# Patient Record
Sex: Male | Born: 1970 | Race: Black or African American | Hispanic: No | Marital: Married | Smoking: Former smoker
Health system: Southern US, Community
[De-identification: ages and names within clinical notes are randomized; demographics above are authoritative.]

## PROBLEM LIST (undated history)

## (undated) DIAGNOSIS — G4700 Insomnia, unspecified: Secondary | ICD-10-CM

## (undated) DIAGNOSIS — N419 Inflammatory disease of prostate, unspecified: Secondary | ICD-10-CM

## (undated) DIAGNOSIS — K219 Gastro-esophageal reflux disease without esophagitis: Secondary | ICD-10-CM

## (undated) DIAGNOSIS — B001 Herpesviral vesicular dermatitis: Secondary | ICD-10-CM

## (undated) DIAGNOSIS — C801 Malignant (primary) neoplasm, unspecified: Secondary | ICD-10-CM

## (undated) DIAGNOSIS — B351 Tinea unguium: Secondary | ICD-10-CM

## (undated) DIAGNOSIS — M545 Low back pain, unspecified: Secondary | ICD-10-CM

## (undated) DIAGNOSIS — F419 Anxiety disorder, unspecified: Secondary | ICD-10-CM

## (undated) HISTORY — DX: Inflammatory disease of prostate, unspecified: N41.9

## (undated) HISTORY — DX: Low back pain, unspecified: M54.50

## (undated) HISTORY — DX: Low back pain: M54.5

## (undated) HISTORY — DX: Malignant (primary) neoplasm, unspecified: C80.1

## (undated) HISTORY — DX: Tinea unguium: B35.1

## (undated) HISTORY — DX: Insomnia, unspecified: G47.00

## (undated) HISTORY — DX: Anxiety disorder, unspecified: F41.9

## (undated) HISTORY — PX: OTHER SURGICAL HISTORY: SHX169

## (undated) HISTORY — DX: Gastro-esophageal reflux disease without esophagitis: K21.9

## (undated) HISTORY — DX: Herpesviral vesicular dermatitis: B00.1

---

## 2001-07-21 ENCOUNTER — Inpatient Hospital Stay (HOSPITAL_COMMUNITY): Admission: RE | Admit: 2001-07-21 | Discharge: 2001-07-22 | Payer: Self-pay | Admitting: Orthopaedic Surgery

## 2004-10-08 ENCOUNTER — Ambulatory Visit: Payer: Self-pay | Admitting: Family Medicine

## 2004-10-31 ENCOUNTER — Ambulatory Visit: Payer: Self-pay | Admitting: Family Medicine

## 2005-06-23 ENCOUNTER — Ambulatory Visit: Payer: Self-pay | Admitting: Family Medicine

## 2006-06-30 ENCOUNTER — Ambulatory Visit: Payer: Self-pay | Admitting: Family Medicine

## 2006-07-20 ENCOUNTER — Ambulatory Visit: Payer: Self-pay | Admitting: Family Medicine

## 2006-07-20 LAB — CONVERTED CEMR LAB
ALT: 35 units/L (ref 0–53)
AST: 42 units/L — ABNORMAL HIGH (ref 0–37)
Albumin: 4.2 g/dL (ref 3.5–5.2)
Alkaline Phosphatase: 46 units/L (ref 39–117)
BUN: 16 mg/dL (ref 6–23)
Basophils Absolute: 0 10*3/uL (ref 0.0–0.1)
Basophils Relative: 0.3 % (ref 0.0–1.0)
Bilirubin, Direct: 0.2 mg/dL (ref 0.0–0.3)
CO2: 29 meq/L (ref 19–32)
Calcium: 9 mg/dL (ref 8.4–10.5)
Chloride: 106 meq/L (ref 96–112)
Cholesterol: 178 mg/dL (ref 0–200)
Creatinine, Ser: 0.9 mg/dL (ref 0.4–1.5)
Eosinophils Absolute: 0.2 10*3/uL (ref 0.0–0.6)
Eosinophils Relative: 2.6 % (ref 0.0–5.0)
GFR calc Af Amer: 122 mL/min
GFR calc non Af Amer: 101 mL/min
Glucose, Bld: 62 mg/dL — ABNORMAL LOW (ref 70–99)
HCT: 42.1 % (ref 39.0–52.0)
HDL: 89.1 mg/dL (ref >39.0)
Hemoglobin: 13.5 g/dL (ref 13.0–17.0)
LDL Cholesterol: 78 mg/dL (ref 0–99)
Lymphocytes Relative: 55.7 % — ABNORMAL HIGH (ref 12.0–46.0)
MCHC: 32.2 g/dL (ref 30.0–36.0)
MCV: 72.5 fL — ABNORMAL LOW (ref 78.0–100.0)
Monocytes Absolute: 0.6 10*3/uL (ref 0.2–0.7)
Monocytes Relative: 8.7 % (ref 3.0–11.0)
Neutro Abs: 2.3 10*3/uL (ref 1.4–7.7)
Neutrophils Relative %: 32.7 % — ABNORMAL LOW (ref 43.0–77.0)
Platelets: 263 10*3/uL (ref 150–400)
Potassium: 3.9 meq/L (ref 3.5–5.1)
RBC: 5.8 M/uL (ref 4.22–5.81)
RDW: 15.7 % — ABNORMAL HIGH (ref 11.5–14.6)
Sodium: 141 meq/L (ref 135–145)
TSH: 0.61 microintl units/mL (ref 0.35–5.50)
Total Bilirubin: 1.6 mg/dL — ABNORMAL HIGH (ref 0.3–1.2)
Total CHOL/HDL Ratio: 2
Total Protein: 6.6 g/dL (ref 6.0–8.3)
Triglycerides: 57 mg/dL (ref 0–149)
VLDL: 11 mg/dL (ref 0–40)
WBC: 6.9 10*3/uL (ref 4.5–10.5)

## 2006-07-23 ENCOUNTER — Ambulatory Visit: Payer: Self-pay | Admitting: Family Medicine

## 2006-08-14 ENCOUNTER — Encounter: Payer: Self-pay | Admitting: Family Medicine

## 2006-08-14 LAB — HM COLONOSCOPY

## 2006-08-26 ENCOUNTER — Encounter: Payer: Self-pay | Admitting: Family Medicine

## 2006-09-03 ENCOUNTER — Encounter: Payer: Self-pay | Admitting: Family Medicine

## 2006-10-15 DIAGNOSIS — F411 Generalized anxiety disorder: Secondary | ICD-10-CM | POA: Insufficient documentation

## 2006-10-15 DIAGNOSIS — K219 Gastro-esophageal reflux disease without esophagitis: Secondary | ICD-10-CM | POA: Insufficient documentation

## 2006-10-15 DIAGNOSIS — M545 Low back pain, unspecified: Secondary | ICD-10-CM | POA: Insufficient documentation

## 2007-01-14 HISTORY — PX: OTHER SURGICAL HISTORY: SHX169

## 2007-02-19 ENCOUNTER — Ambulatory Visit: Payer: Self-pay | Admitting: Family Medicine

## 2007-02-19 LAB — CONVERTED CEMR LAB
ALT: 51 units/L (ref 0–53)
AST: 46 units/L — ABNORMAL HIGH (ref 0–37)
Albumin: 4.2 g/dL (ref 3.5–5.2)
Alkaline Phosphatase: 45 units/L (ref 39–117)
BUN: 12 mg/dL (ref 6–23)
Basophils Absolute: 0 10*3/uL (ref 0.0–0.1)
Basophils Relative: 0 % (ref 0.0–1.0)
Bilirubin Urine: NEGATIVE
Bilirubin, Direct: 0.3 mg/dL (ref 0.0–0.3)
Blood in Urine, dipstick: NEGATIVE
CO2: 31 meq/L (ref 19–32)
Calcium: 9.4 mg/dL (ref 8.4–10.5)
Chloride: 104 meq/L (ref 96–112)
Cholesterol: 166 mg/dL (ref 0–200)
Creatinine, Ser: 1.1 mg/dL (ref 0.4–1.5)
Eosinophils Absolute: 0.1 10*3/uL (ref 0.0–0.6)
Eosinophils Relative: 1.7 % (ref 0.0–5.0)
GFR calc Af Amer: 96 mL/min
GFR calc non Af Amer: 80 mL/min
Glucose, Bld: 77 mg/dL (ref 70–99)
Glucose, Urine, Semiquant: NEGATIVE
HCT: 40.2 % (ref 39.0–52.0)
HDL: 75.1 mg/dL (ref >39.0)
Hemoglobin: 12.8 g/dL — ABNORMAL LOW (ref 13.0–17.0)
Ketones, urine, test strip: NEGATIVE
LDL Cholesterol: 78 mg/dL (ref 0–99)
Lymphocytes Relative: 53.5 % — ABNORMAL HIGH (ref 12.0–46.0)
MCHC: 31.8 g/dL (ref 30.0–36.0)
MCV: 71.6 fL — ABNORMAL LOW (ref 78.0–100.0)
Monocytes Absolute: 0.5 10*3/uL (ref 0.2–0.7)
Monocytes Relative: 6.9 % (ref 3.0–11.0)
Neutro Abs: 2.5 10*3/uL (ref 1.4–7.7)
Neutrophils Relative %: 37.9 % — ABNORMAL LOW (ref 43.0–77.0)
Nitrite: NEGATIVE
Platelets: 271 10*3/uL (ref 150–400)
Potassium: 5 meq/L (ref 3.5–5.1)
RBC: 5.62 M/uL (ref 4.22–5.81)
RDW: 15.6 % — ABNORMAL HIGH (ref 11.5–14.6)
Sodium: 142 meq/L (ref 135–145)
Specific Gravity, Urine: >=1.03
TSH: 0.26 microintl units/mL — ABNORMAL LOW (ref 0.35–5.50)
Total Bilirubin: 1.3 mg/dL — ABNORMAL HIGH (ref 0.3–1.2)
Total CHOL/HDL Ratio: 2.2
Total Protein: 6.3 g/dL (ref 6.0–8.3)
Triglycerides: 63 mg/dL (ref 0–149)
Urobilinogen, UA: 0.2
VLDL: 13 mg/dL (ref 0–40)
WBC Urine, dipstick: NEGATIVE
WBC: 6.7 10*3/uL (ref 4.5–10.5)
pH: 5.5

## 2007-03-09 ENCOUNTER — Ambulatory Visit: Payer: Self-pay | Admitting: Family Medicine

## 2007-04-04 ENCOUNTER — Emergency Department (HOSPITAL_COMMUNITY): Admission: EM | Admit: 2007-04-04 | Discharge: 2007-04-04 | Payer: Self-pay | Admitting: Family Medicine

## 2007-04-07 ENCOUNTER — Encounter: Payer: Self-pay | Admitting: Family Medicine

## 2007-04-16 ENCOUNTER — Encounter: Payer: Self-pay | Admitting: Family Medicine

## 2007-06-02 ENCOUNTER — Encounter: Payer: Self-pay | Admitting: Family Medicine

## 2007-07-07 ENCOUNTER — Ambulatory Visit (HOSPITAL_COMMUNITY): Admission: RE | Admit: 2007-07-07 | Discharge: 2007-07-08 | Payer: Self-pay | Admitting: Orthopaedic Surgery

## 2007-07-15 ENCOUNTER — Encounter: Payer: Self-pay | Admitting: Family Medicine

## 2007-11-22 ENCOUNTER — Ambulatory Visit: Payer: Self-pay | Admitting: Family Medicine

## 2008-03-20 ENCOUNTER — Ambulatory Visit: Payer: Self-pay | Admitting: Family Medicine

## 2008-03-20 LAB — CONVERTED CEMR LAB
Bilirubin Urine: NEGATIVE
Blood in Urine, dipstick: NEGATIVE
Glucose, Urine, Semiquant: NEGATIVE
Ketones, urine, test strip: NEGATIVE
Nitrite: NEGATIVE
Protein, U semiquant: NEGATIVE
Specific Gravity, Urine: 1.02
Urobilinogen, UA: 0.2
WBC Urine, dipstick: NEGATIVE
pH: 6.5

## 2008-03-22 LAB — CONVERTED CEMR LAB
ALT: 42 units/L (ref 0–53)
AST: 39 units/L — ABNORMAL HIGH (ref 0–37)
Albumin: 4.5 g/dL (ref 3.5–5.2)
Alkaline Phosphatase: 54 units/L (ref 39–117)
BUN: 12 mg/dL (ref 6–23)
Basophils Absolute: 0 10*3/uL (ref 0.0–0.1)
Basophils Relative: 0.1 % (ref 0.0–3.0)
Bilirubin, Direct: 0.2 mg/dL (ref 0.0–0.3)
CO2: 29 meq/L (ref 19–32)
Calcium: 9.2 mg/dL (ref 8.4–10.5)
Chloride: 101 meq/L (ref 96–112)
Cholesterol: 167 mg/dL (ref 0–200)
Creatinine, Ser: 0.9 mg/dL (ref 0.4–1.5)
Eosinophils Absolute: 0.1 10*3/uL (ref 0.0–0.7)
Eosinophils Relative: 1.7 % (ref 0.0–5.0)
GFR calc Af Amer: 121 mL/min
GFR calc non Af Amer: 100 mL/min
Glucose, Bld: 85 mg/dL (ref 70–99)
HCT: 41.8 % (ref 39.0–52.0)
HDL: 74.8 mg/dL (ref >39.0)
Hemoglobin: 13.9 g/dL (ref 13.0–17.0)
LDL Cholesterol: 74 mg/dL (ref 0–99)
Lymphocytes Relative: 42.9 % (ref 12.0–46.0)
MCHC: 33.1 g/dL (ref 30.0–36.0)
MCV: 72.4 fL — ABNORMAL LOW (ref 78.0–100.0)
Monocytes Absolute: 0.3 10*3/uL (ref 0.1–1.0)
Monocytes Relative: 5.5 % (ref 3.0–12.0)
Neutro Abs: 3 10*3/uL (ref 1.4–7.7)
Neutrophils Relative %: 49.8 % (ref 43.0–77.0)
Platelets: 227 10*3/uL (ref 150–400)
Potassium: 3.5 meq/L (ref 3.5–5.1)
RBC: 5.78 M/uL (ref 4.22–5.81)
RDW: 15.6 % — ABNORMAL HIGH (ref 11.5–14.6)
Sodium: 143 meq/L (ref 135–145)
TSH: 0.62 microintl units/mL (ref 0.35–5.50)
Total Bilirubin: 1.3 mg/dL — ABNORMAL HIGH (ref 0.3–1.2)
Total CHOL/HDL Ratio: 2.2
Total Protein: 7 g/dL (ref 6.0–8.3)
Triglycerides: 92 mg/dL (ref 0–149)
VLDL: 18 mg/dL (ref 0–40)
WBC: 6 10*3/uL (ref 4.5–10.5)

## 2008-03-24 ENCOUNTER — Ambulatory Visit: Payer: Self-pay | Admitting: Family Medicine

## 2008-03-24 DIAGNOSIS — G4700 Insomnia, unspecified: Secondary | ICD-10-CM | POA: Insufficient documentation

## 2008-07-19 ENCOUNTER — Telehealth: Payer: Self-pay | Admitting: Family Medicine

## 2008-08-25 ENCOUNTER — Ambulatory Visit: Payer: Self-pay | Admitting: Family Medicine

## 2008-10-19 ENCOUNTER — Ambulatory Visit: Payer: Self-pay | Admitting: Family Medicine

## 2008-10-25 ENCOUNTER — Ambulatory Visit: Payer: Self-pay | Admitting: Family Medicine

## 2008-10-25 DIAGNOSIS — R6882 Decreased libido: Secondary | ICD-10-CM | POA: Insufficient documentation

## 2008-11-01 ENCOUNTER — Telehealth: Payer: Self-pay | Admitting: Family Medicine

## 2008-12-14 ENCOUNTER — Telehealth: Payer: Self-pay | Admitting: Family Medicine

## 2008-12-18 ENCOUNTER — Ambulatory Visit: Payer: Self-pay | Admitting: Family Medicine

## 2008-12-18 DIAGNOSIS — Z8546 Personal history of malignant neoplasm of prostate: Secondary | ICD-10-CM | POA: Insufficient documentation

## 2008-12-18 LAB — CONVERTED CEMR LAB
Bilirubin Urine: NEGATIVE
Blood in Urine, dipstick: NEGATIVE
Glucose, Urine, Semiquant: NEGATIVE
Ketones, urine, test strip: NEGATIVE
Nitrite: NEGATIVE
Protein, U semiquant: NEGATIVE
Specific Gravity, Urine: <1.005
Urobilinogen, UA: 0.2
WBC Urine, dipstick: NEGATIVE
pH: 7

## 2008-12-19 LAB — CONVERTED CEMR LAB
PSA: 3.92 ng/mL (ref 0.10–4.00)
Testosterone: 572.97 ng/dL (ref 350.00–890.00)

## 2009-03-26 ENCOUNTER — Telehealth: Payer: Self-pay | Admitting: Family Medicine

## 2009-03-27 ENCOUNTER — Ambulatory Visit: Payer: Self-pay | Admitting: Family Medicine

## 2009-03-27 LAB — CONVERTED CEMR LAB
Bilirubin Urine: NEGATIVE
Blood in Urine, dipstick: NEGATIVE
Glucose, Urine, Semiquant: NEGATIVE
Ketones, urine, test strip: NEGATIVE
Nitrite: NEGATIVE
Protein, U semiquant: NEGATIVE
Specific Gravity, Urine: <1.005
Urobilinogen, UA: 0.2
WBC Urine, dipstick: NEGATIVE
pH: 5.5

## 2009-03-28 LAB — CONVERTED CEMR LAB
ALT: 53 units/L (ref 0–53)
AST: 37 units/L (ref 0–37)
Albumin: 4.5 g/dL (ref 3.5–5.2)
Alkaline Phosphatase: 49 units/L (ref 39–117)
BUN: 11 mg/dL (ref 6–23)
Basophils Absolute: 0 10*3/uL (ref 0.0–0.1)
Basophils Relative: 0.3 % (ref 0.0–3.0)
Bilirubin, Direct: 0.1 mg/dL (ref 0.0–0.3)
CO2: 30 meq/L (ref 19–32)
Calcium: 9.4 mg/dL (ref 8.4–10.5)
Chloride: 108 meq/L (ref 96–112)
Cholesterol: 181 mg/dL (ref 0–200)
Creatinine, Ser: 0.8 mg/dL (ref 0.4–1.5)
Eosinophils Absolute: 0.1 10*3/uL (ref 0.0–0.7)
Eosinophils Relative: 1.7 % (ref 0.0–5.0)
GFR calc non Af Amer: 113.65 mL/min (ref >60)
Glucose, Bld: 88 mg/dL (ref 70–99)
HCT: 41.4 % (ref 39.0–52.0)
HDL: 84.4 mg/dL (ref >39.00)
Hemoglobin: 13.1 g/dL (ref 13.0–17.0)
LDL Cholesterol: 73 mg/dL (ref 0–99)
Lymphocytes Relative: 57.6 % — ABNORMAL HIGH (ref 12.0–46.0)
Lymphs Abs: 3.6 10*3/uL (ref 0.7–4.0)
MCHC: 31.5 g/dL (ref 30.0–36.0)
MCV: 74.1 fL — ABNORMAL LOW (ref 78.0–100.0)
Monocytes Absolute: 0.4 10*3/uL (ref 0.1–1.0)
Monocytes Relative: 6.1 % (ref 3.0–12.0)
Neutro Abs: 2.2 10*3/uL (ref 1.4–7.7)
Neutrophils Relative %: 34.3 % — ABNORMAL LOW (ref 43.0–77.0)
Platelets: 254 10*3/uL (ref 150.0–400.0)
Potassium: 4.2 meq/L (ref 3.5–5.1)
RBC: 5.58 M/uL (ref 4.22–5.81)
RDW: 15.4 % — ABNORMAL HIGH (ref 11.5–14.6)
Sodium: 142 meq/L (ref 135–145)
TSH: 0.69 microintl units/mL (ref 0.35–5.50)
Total Bilirubin: 0.8 mg/dL (ref 0.3–1.2)
Total CHOL/HDL Ratio: 2
Total Protein: 7 g/dL (ref 6.0–8.3)
Triglycerides: 116 mg/dL (ref 0.0–149.0)
VLDL: 23.2 mg/dL (ref 0.0–40.0)
WBC: 6.3 10*3/uL (ref 4.5–10.5)

## 2009-03-30 ENCOUNTER — Telehealth: Payer: Self-pay | Admitting: Family Medicine

## 2009-03-30 ENCOUNTER — Ambulatory Visit: Payer: Self-pay | Admitting: Family Medicine

## 2009-04-02 ENCOUNTER — Ambulatory Visit: Payer: Self-pay | Admitting: Family Medicine

## 2009-04-03 ENCOUNTER — Telehealth: Payer: Self-pay | Admitting: Family Medicine

## 2010-02-12 NOTE — Progress Notes (Signed)
  Phone Note Other Incoming   Summary of Call: noted Initial call taken by: Nelwyn Salisbury MD,  November 01, 2008 2:36 PM    Patient was here for labs the 13th. We both tryed to stick him and were unsuccesful. Sorry. He said he was going to get some lunch and would come back later but never showed up.

## 2010-02-12 NOTE — Consult Note (Signed)
Summary: Dr Ophelia Charter note  Dr Ophelia Charter note   Imported By: Kassie Mends 04/28/2007 16:02:12  _____________________________________________________________________  External Attachment:    Type:   Image     Comment:   Dr Ophelia Charter note

## 2010-02-12 NOTE — Progress Notes (Signed)
  Phone Note From Pharmacy   Caller:  pharmacy Call For: James Bradley  Summary of Call: refill xanax 0.5mg  1 by mouth two times a day   Follow-up for Phone Call        Dr Caryl Never approved for cause pt is going out of town of Saturday Follow-up by: Alfred Levins, CMA,  July 19, 2008 11:25 AM

## 2010-02-12 NOTE — Procedures (Signed)
Summary: endoscopy  endoscopy   Imported By: Kassie Mends 03/10/2007 08:39:07  _____________________________________________________________________  External Attachment:    Type:   Image     Comment:   endoscopy

## 2010-02-12 NOTE — Progress Notes (Signed)
Summary: androgel  Phone Note Call from Patient Call back at Work Phone [phone] Call back at [phone] wife's c   Caller: wife,elisha Summary of Call: Cheaper to do mail order for the Androgel.  Please fax 90 day Rx to [facility] [phone].  Wife will have fax request sent from [facility].   Initial call taken by: Rudy Jew, RN,  March 30, 2009 1:37 PM  Follow-up for Phone Call        ready to be faxed Follow-up by: Nelwyn Salisbury MD,  March 30, 2009 3:32 PM  Additional Follow-up for Phone Call Additional follow up Details #1::        faxed to [facility]. Additional Follow-up by: Raechel Ache, RN,  March 30, 2009 3:36 PM    Prescriptions: ANDROGEL PUMP 1 % GEL (TESTOSTERONE) apply 10 grams once daily  #90 x 1   Entered and Authorized by:   Nelwyn Salisbury MD   Signed by:   Nelwyn Salisbury MD on 03/30/2009   Method used:   Printed then faxed to ...       [facility]* (retail)       [address]       [address] Shipping/mailing       [place], [place]  [address]       Ph: [phone]       Fax: [phone]   RxID:   [id number]

## 2010-02-12 NOTE — Assessment & Plan Note (Signed)
Summary: stuffy nose/cough/problems sleeping/cjr   Vital Signs:  Patient profile:   40 year old male Weight:      143 pounds Temp:     98.2 degrees F oral Pulse rate:   83 / minute BP sitting:   98 / 76  (left arm) Cuff size:   regular  Vitals Entered By: Alfred Levins, CMA (August 25, 2008 9:45 AM) CC: insomnia, panic attacks   History of Present Illness: Here for refills but also to discuss worsening stress issues. Things are going well in his life for the most part, but he finds himself worrying  all the time about little things, can't relax, can't sleep, can't eat. Denies any sadness or hopelessness. tries to exercise when he can.   Allergies: No Known Drug Allergies  Past History:  Past Medical History: Reviewed history from 10/15/2006 and no changes required. Prostatitis Insomnia Anxiety GERD Low back pain Herpes Labialis Onychomycosis  Family History: Reviewed history from 10/15/2006 and no changes required. Family History of Arthritis Family History Diabetes 1st degree relative  Review of Systems  The patient denies anorexia, fever, weight loss, weight gain, vision loss, decreased hearing, hoarseness, chest pain, syncope, dyspnea on exertion, peripheral edema, prolonged cough, headaches, hemoptysis, abdominal pain, melena, hematochezia, severe indigestion/heartburn, hematuria, incontinence, genital sores, muscle weakness, suspicious skin lesions, transient blindness, difficulty walking, depression, unusual weight change, abnormal bleeding, enlarged lymph nodes, angioedema, breast masses, and testicular masses.    Physical Exam  General:  Well-developed,well-nourished,in no acute distress; alert,appropriate and cooperative throughout examination Psych:  Oriented X3, memory intact for recent and remote, normally interactive, good eye contact, withdrawn, and moderately anxious.     Impression & Recommendations:  Problem # 1:  INSOMNIA (ICD-780.52)  His updated  medication list for this problem includes:    Zolpidem Tartrate 10 Mg Tabs (Zolpidem tartrate) .Marland Kitchen... At bedtime  Problem # 2:  ANXIETY (ICD-300.00)  His updated medication list for this problem includes:    Alprazolam 0.5 Mg Tabs (Alprazolam) .Marland Kitchen... Three times a day as needed anxiety    Zoloft 50 Mg Tabs (Sertraline hcl) ..... Once daily  Complete Medication List: 1)  Alprazolam 0.5 Mg Tabs (Alprazolam) .... Three times a day as needed anxiety 2)  Zolpidem Tartrate 10 Mg Tabs (Zolpidem tartrate) .... At bedtime 3)  Zoloft 50 Mg Tabs (Sertraline hcl) .... Once daily  Patient Instructions: 1)  we discussed this for 25 minutes. refilled Zolpidem and xanax. Will add Zoloft daily.  2)  Please schedule a follow-up appointment in 2 weeks.  Prescriptions: ZOLPIDEM TARTRATE 10 MG TABS (ZOLPIDEM TARTRATE) at bedtime  #30 x 5   Entered and Authorized by:   Nelwyn Salisbury MD   Signed by:   Nelwyn Salisbury MD on 08/25/2008   Method used:   Print then Give to Patient   RxID:   [id number] ALPRAZOLAM 0.5 MG  TABS (ALPRAZOLAM) three times a day as needed anxiety  #90 x 5   Entered and Authorized by:   Nelwyn Salisbury MD   Signed by:   Nelwyn Salisbury MD on 08/25/2008   Method used:   Print then Give to Patient   RxID:   [id number] ZOLOFT 50 MG TABS (SERTRALINE HCL) once daily  #30 x 2   Entered and Authorized by:   Nelwyn Salisbury MD   Signed by:   Nelwyn Salisbury MD on 08/25/2008   Method used:   Print then Give to Patient   RxID:  [id number]  

## 2010-02-12 NOTE — Assessment & Plan Note (Signed)
Summary: cpx/mhf   Vital Signs:  Patient Profile:   40 Years Old Male Height:     65 inches Weight:      143 pounds Temp:     98.6 degrees F oral Pulse rate:   72 / minute Pulse rhythm:   regular BP sitting:   104 / 70  (left arm) Cuff size:   regular  Vitals Entered By: Alfred Levins, CMA (March 09, 2007 1:34 PM)                 Chief Complaint:  cpx.  History of Present Illness: 40 yr old male for cpx. Feels good physically but has been under a lot of job stress lately. He feels anxious all the time, cannot relax. Says he loses his temper too quickly with his family. He had been on Paxil until last year, and it seemed to help. However he stopped some time ago.    Current Allergies: No known allergies   Past Medical History:    Reviewed history from 10/15/2006 and no changes required:       Prostatitis       Insomnia       Anxiety       GERD       Low back pain       Herpes Labialis       Onychomycosis  Past Surgical History:    Reviewed history from 10/15/2006 and no changes required:       Lumbar Disk Surgery       EGD, capsule endoscopy, and colonoscopy per Dr. Jeani Hawking 8-08, normal except some gastritis   Family History:    Reviewed history from 10/15/2006 and no changes required:       Family History of Arthritis       Family History Diabetes 1st degree relative  Social History:    Reviewed history from 10/15/2006 and no changes required:       Occupation:       Married       Current Smoker       Alcohol use-yes    Review of Systems  The patient denies anorexia, fever, weight loss, weight gain, vision loss, decreased hearing, hoarseness, chest pain, syncope, dyspnea on exhertion, peripheral edema, prolonged cough, hemoptysis, abdominal pain, melena, hematochezia, severe indigestion/heartburn, hematuria, incontinence, genital sores, muscle weakness, suspicious skin lesions, transient blindness, difficulty walking, depression, unusual weight  change, abnormal bleeding, enlarged lymph nodes, angioedema, breast masses, and testicular masses.     Physical Exam  General:     Well-developed,well-nourished,in no acute distress; alert,appropriate and cooperative throughout examination Head:     Normocephalic and atraumatic without obvious abnormalities. No apparent alopecia or balding. Eyes:     No corneal or conjunctival inflammation noted. EOMI. Perrla. Funduscopic exam benign, without hemorrhages, exudates or papilledema. Vision grossly normal. Ears:     External ear exam shows no significant lesions or deformities.  Otoscopic examination reveals clear canals, tympanic membranes are intact bilaterally without bulging, retraction, inflammation or discharge. Hearing is grossly normal bilaterally. Nose:     External nasal examination shows no deformity or inflammation. Nasal mucosa are pink and moist without lesions or exudates. Mouth:     Oral mucosa and oropharynx without lesions or exudates.  Teeth in good repair. Neck:     No deformities, masses, or tenderness noted. Chest Wall:     No deformities, masses, tenderness or gynecomastia noted. Lungs:     Normal respiratory effort, chest expands symmetrically.  Lungs are clear to auscultation, no crackles or wheezes. Heart:     Normal rate and regular rhythm. S1 and S2 normal without gallop, murmur, click, rub or other extra sounds. Abdomen:     Bowel sounds positive,abdomen soft and non-tender without masses, organomegaly or hernias noted. Genitalia:     Testes bilaterally descended without nodularity, tenderness or masses. No scrotal masses or lesions. No penis lesions or urethral discharge. Msk:     No deformity or scoliosis noted of thoracic or lumbar spine.   Pulses:     R and L carotid,radial,femoral,dorsalis pedis and posterior tibial pulses are full and equal bilaterally Extremities:     No clubbing, cyanosis, edema, or deformity noted with normal full range of motion of  all joints.   Neurologic:     No cranial nerve deficits noted. Station and gait are normal. Plantar reflexes are down-going bilaterally. DTRs are symmetrical throughout. Sensory, motor and coordinative functions appear intact. Skin:     Intact without suspicious lesions or rashes Cervical Nodes:     No lymphadenopathy noted Axillary Nodes:     No palpable lymphadenopathy Inguinal Nodes:     No significant adenopathy Psych:     Cognition and judgment appear intact. Alert and cooperative with normal attention span and concentration. No apparent delusions, illusions, hallucinations    Impression & Recommendations:  Problem # 1:  PHYSICAL EXAMINATION (ICD-V70.0)  Problem # 2:  ANXIETY (ICD-300.00)  His updated medication list for this problem includes:    Paxil 30 Mg Tabs (Paroxetine hcl) ..... Once daily    Alprazolam 0.5 Mg Tabs (Alprazolam) .Marland Kitchen..Marland Kitchen Two times a day as needed anxiety   Complete Medication List: 1)  Paxil 30 Mg Tabs (Paroxetine hcl) .... Once daily 2)  Alprazolam 0.5 Mg Tabs (Alprazolam) .... Two times a day as needed anxiety   Patient Instructions: 1)  Please schedule a follow-up appointment as needed.    Prescriptions: ALPRAZOLAM 0.5 MG  TABS (ALPRAZOLAM) two times a day as needed anxiety  #60 x 5   Entered and Authorized by:   Nelwyn Salisbury MD   Signed by:   Nelwyn Salisbury MD on 03/09/2007   Method used:   Print then Give to Patient   RxID:   [id number] PAXIL 30 MG  TABS (PAROXETINE HCL) once daily  #30 x 11   Entered and Authorized by:   Nelwyn Salisbury MD   Signed by:   Nelwyn Salisbury MD on 03/09/2007   Method used:   Print then Give to Patient   RxID:   [id number]  ]

## 2010-02-12 NOTE — Assessment & Plan Note (Signed)
Summary: 1 WK ROV//SLM   Vital Signs:  Patient profile:   40 year old male Weight:      142 pounds Temp:     98.2 degrees F oral Pulse rate:   96 / minute BP sitting:   114 / 72  (left arm) Cuff size:   regular  Vitals Entered By: Alfred Levins, CMA (October 25, 2008 9:32 AM) CC: f/u on back pain, needs to be released to go back to work   History of Present Illness: Here to follow up on low back pain after he fell on a ladder at home on 10-18-08.  He was seen here on 10-19-08 and started on meds. he has been out of work since then. He feels much better now, no more pain at all, only some mild stiffness in the back. He wants to return to work tomorrow. We had also discussed some problems he is having with low libido, and we need to check some labs.   Allergies: No Known Drug Allergies  Past History:  Past Medical History: Reviewed history from 10/15/2006 and no changes required. Prostatitis Insomnia Anxiety GERD Low back pain Herpes Labialis Onychomycosis  Review of Systems  The patient denies anorexia, fever, weight loss, weight gain, vision loss, decreased hearing, hoarseness, chest pain, syncope, dyspnea on exertion, peripheral edema, prolonged cough, headaches, hemoptysis, abdominal pain, melena, hematochezia, severe indigestion/heartburn, hematuria, incontinence, genital sores, muscle weakness, suspicious skin lesions, transient blindness, difficulty walking, depression, unusual weight change, abnormal bleeding, enlarged lymph nodes, angioedema, breast masses, and testicular masses.    Physical Exam  General:  Well-developed,well-nourished,in no acute distress; alert,appropriate and cooperative throughout examination Msk:  low back is not tender and has full ROM   Impression & Recommendations:  Problem # 1:  LOW BACK PAIN (ICD-724.2)  His updated medication list for this problem includes:    Flexeril 10 Mg Tabs (Cyclobenzaprine hcl) .Marland Kitchen... Three times a day as needed  spasm    Vicodin Hp 10-660 Mg Tabs (Hydrocodone-acetaminophen) .Marland Kitchen... 1 q 6 hours as needed pain  Problem # 2:  DECREASED LIBIDO (ICD-799.81)  Orders: Venipuncture (84132) TLB-Testosterone, Total (84403-TESTO)  Complete Medication List: 1)  Alprazolam 0.5 Mg Tabs (Alprazolam) .... Three times a day as needed anxiety 2)  Zolpidem Tartrate 10 Mg Tabs (Zolpidem tartrate) .... At bedtime 3)  Zoloft 50 Mg Tabs (Sertraline hcl) .... Once daily 4)  Flexeril 10 Mg Tabs (Cyclobenzaprine hcl) .... Three times a day as needed spasm 5)  Vicodin Hp 10-660 Mg Tabs (Hydrocodone-acetaminophen) .Marland Kitchen.. 1 q 6 hours as needed pain 6)  Prednisone (pak) 10 Mg Tabs (Prednisone) .... As directed for 12 days  Patient Instructions: 1)  He will return to work with no restrictions tomorrow.  2)  Please schedule a follow-up appointment as needed .

## 2010-02-12 NOTE — Progress Notes (Signed)
  Phone Note Call from Patient   Caller: Patient Reason for Call: Talk to Doctor Summary of Call: Pt called to make appt for OV to speak with Dr Clent Ridges about prostate concerns.... Pt adv that he was suppose to have labwrk (TLB) done on 10/25/2008 but was dehydrated and same could not be done.... Info only - pt will come in for labs (PSA/Testosterone) same day as OV "per pt request". Initial call taken by: Debbra Riding,  December 14, 2008 10:56 AM

## 2010-02-12 NOTE — Assessment & Plan Note (Signed)
Summary: sinus headache/.mhf   Vital Signs:  Patient Profile:   40 Years Old Male Height:     65 inches Weight:      144 pounds Temp:     98.7 degrees F oral Pulse rate:   81 / minute BP sitting:   112 / 74  (left arm) Cuff size:   regular  Vitals Entered By: Alfred Levins, CMA (November 22, 2007 4:04 PM)                 Chief Complaint:  sinus h/a x2 wks.  History of Present Illness: 2 weeks of HA and sinus pressure. Some PND, blowing out green mucus. No cough or fever.     Current Allergies (reviewed today): No known allergies   Past Medical History:    Reviewed history from 10/15/2006 and no changes required:       Prostatitis       Insomnia       Anxiety       GERD       Low back pain       Herpes Labialis       Onychomycosis     Review of Systems  The patient denies anorexia, fever, weight loss, weight gain, vision loss, decreased hearing, hoarseness, chest pain, syncope, dyspnea on exertion, peripheral edema, prolonged cough, hemoptysis, abdominal pain, melena, hematochezia, severe indigestion/heartburn, hematuria, incontinence, genital sores, muscle weakness, suspicious skin lesions, transient blindness, difficulty walking, depression, unusual weight change, abnormal bleeding, enlarged lymph nodes, angioedema, breast masses, and testicular masses.     Physical Exam  General:     Well-developed,well-nourished,in no acute distress; alert,appropriate and cooperative throughout examination Head:     Normocephalic and atraumatic without obvious abnormalities. No apparent alopecia or balding. Eyes:     No corneal or conjunctival inflammation noted. EOMI. Perrla. Funduscopic exam benign, without hemorrhages, exudates or papilledema. Vision grossly normal. Ears:     External ear exam shows no significant lesions or deformities.  Otoscopic examination reveals clear canals, tympanic membranes are intact bilaterally without bulging, retraction, inflammation or  discharge. Hearing is grossly normal bilaterally. Nose:     External nasal examination shows no deformity or inflammation. Nasal mucosa are pink and moist without lesions or exudates. Mouth:     Oral mucosa and oropharynx without lesions or exudates.  Teeth in good repair. Neck:     No deformities, masses, or tenderness noted. Lungs:     Normal respiratory effort, chest expands symmetrically. Lungs are clear to auscultation, no crackles or wheezes.    Impression & Recommendations:  Problem # 1:  ACUTE SINUSITIS, UNSPECIFIED (ICD-461.9)  His updated medication list for this problem includes:    Augmentin 875-125 Mg Tabs (Amoxicillin-pot clavulanate) .Marland Kitchen..Marland Kitchen Two times a day   Complete Medication List: 1)  Paxil 30 Mg Tabs (Paroxetine hcl) .... Once daily 2)  Alprazolam 0.5 Mg Tabs (Alprazolam) .... Two times a day as needed anxiety 3)  Augmentin 875-125 Mg Tabs (Amoxicillin-pot clavulanate) .... Two times a day 4)  Sterapred Ds 12 Day 10 Mg Tabs (Prednisone) .... As directed 5)  Vicodin 5-500 Mg Tabs (Hydrocodone-acetaminophen) .Marland Kitchen.. 1 every 6 hours as needed pain   Patient Instructions: 1)  Please schedule a follow-up appointment as needed.   Prescriptions: VICODIN 5-500 MG TABS (HYDROCODONE-ACETAMINOPHEN) 1 every 6 hours as needed pain  #30 x 0   Entered and Authorized by:   Nelwyn Salisbury MD   Signed by:   Nelwyn Salisbury MD  on 11/22/2007   Method used:   Print then Give to Patient   RxID:   [id number] STERAPRED DS 12 DAY 10 MG TABS (PREDNISONE) as directed  #1 x 0   Entered and Authorized by:   Nelwyn Salisbury MD   Signed by:   Nelwyn Salisbury MD on 11/22/2007   Method used:   Print then Give to Patient   RxID:   [id number] AUGMENTIN 875-125 MG TABS (AMOXICILLIN-POT CLAVULANATE) two times a day  #20 x 0   Entered and Authorized by:   Nelwyn Salisbury MD   Signed by:   Nelwyn Salisbury MD on 11/22/2007   Method used:   Print then Give to Patient   RxID:   [id number]   ]

## 2010-02-12 NOTE — Assessment & Plan Note (Signed)
Summary: cpx/njr/WIFE RSC/NJR   Vital Signs:  Patient profile:   40 year old male Weight:      151 pounds BMI:     24.83 BP sitting:   110 / 84  (left arm) Cuff size:   regular  Vitals Entered By: Raechel Ache, RN (March 30, 2009 9:03 AM) CC: CPX, labs done.   History of Present Illness: 40 yr old male for cpx. he feels well except for decreased sexual drive and some erection difficulties. he is active and eats a healthy diet. He does have a frequent dry cough that worries him. No SOB. He smokes 2 cigars a day.   Preventive Screening-Counseling & Management  Alcohol-Tobacco     Smoking Cessation Counseling: YES  Allergies (verified): No Known Drug Allergies  Past History:  Past Medical History: Reviewed history from 10/15/2006 and no changes required. Prostatitis Insomnia Anxiety GERD Low back pain Herpes Labialis Onychomycosis  Past Surgical History: Reviewed history from 10/19/2008 and no changes required. microdiskectomy twice on L5-S1, last time per Dr. Ophelia Charter in 06-2007 EGD, capsule endoscopy, and colonoscopy per Dr. Jeani Hawking 8-08, normal except some gastritis ESI twice to lumbar spine per Dr. Ophelia Charter 04-2007  Family History: Reviewed history from 10/15/2006 and no changes required. Family History of Arthritis Family History Diabetes 1st degree relative  Social History: Reviewed history from 10/15/2006 and no changes required. Occupation: Married Current Smoker Alcohol use-yes  Review of Systems  The patient denies anorexia, fever, weight loss, weight gain, vision loss, decreased hearing, hoarseness, chest pain, syncope, dyspnea on exertion, peripheral edema, prolonged cough, headaches, hemoptysis, abdominal pain, melena, hematochezia, severe indigestion/heartburn, hematuria, incontinence, genital sores, muscle weakness, suspicious skin lesions, transient blindness, difficulty walking, depression, unusual weight change, abnormal bleeding, enlarged lymph  nodes, angioedema, breast masses, and testicular masses.    Physical Exam  General:  Well-developed,well-nourished,in no acute distress; alert,appropriate and cooperative throughout examination Head:  Normocephalic and atraumatic without obvious abnormalities. No apparent alopecia or balding. Eyes:  No corneal or conjunctival inflammation noted. EOMI. Perrla. Funduscopic exam benign, without hemorrhages, exudates or papilledema. Vision grossly normal. Ears:  External ear exam shows no significant lesions or deformities.  Otoscopic examination reveals clear canals, tympanic membranes are intact bilaterally without bulging, retraction, inflammation or discharge. Hearing is grossly normal bilaterally. Nose:  External nasal examination shows no deformity or inflammation. Nasal mucosa are pink and moist without lesions or exudates. Mouth:  Oral mucosa and oropharynx without lesions or exudates.  Teeth in good repair. Neck:  No deformities, masses, or tenderness noted. Chest Wall:  No deformities, masses, tenderness or gynecomastia noted. Lungs:  Normal respiratory effort, chest expands symmetrically. Lungs are clear to auscultation, no crackles or wheezes. Heart:  Normal rate and regular rhythm. S1 and S2 normal without gallop, murmur, click, rub or other extra sounds. Abdomen:  Bowel sounds positive,abdomen soft and non-tender without masses, organomegaly or hernias noted. Genitalia:  Testes bilaterally descended without nodularity, tenderness or masses. No scrotal masses or lesions. No penis lesions or urethral discharge. Msk:  No deformity or scoliosis noted of thoracic or lumbar spine.   Pulses:  R and L carotid,radial,femoral,dorsalis pedis and posterior tibial pulses are full and equal bilaterally Extremities:  No clubbing, cyanosis, edema, or deformity noted with normal full range of motion of all joints.   Neurologic:  No cranial nerve deficits noted. Station and gait are normal. Plantar  reflexes are down-going bilaterally. DTRs are symmetrical throughout. Sensory, motor and coordinative functions appear intact. Skin:  Intact without suspicious lesions or rashes Cervical Nodes:  No lymphadenopathy noted Axillary Nodes:  No palpable lymphadenopathy Inguinal Nodes:  No significant adenopathy Psych:  Cognition and judgment appear intact. Alert and cooperative with normal attention span and concentration. No apparent delusions, illusions, hallucinations   Impression & Recommendations:  Problem # 1:  PHYSICAL EXAMINATION (ICD-V70.0)  Complete Medication List: 1)  Alprazolam 0.5 Mg Tabs (Alprazolam) .... Three times a day as needed anxiety 2)  Zolpidem Tartrate 10 Mg Tabs (Zolpidem tartrate) .... At bedtime 3)  Lamisil 250 Mg Tabs (Terbinafine hcl) .... Once daily 4)  Androgel Pump 1 % Gel (Testosterone) .... Apply 10 grams once daily  Other Orders: Tdap => 23yrs IM ([address]) Admin 1st Vaccine (60454) T-2 View CXR (71020TC)  Patient Instructions: 1)  Tobacco is very bad for your health and your loved ones ! You should stop smoking !  2)  get a CXR 3)  try Androgel Prescriptions: ANDROGEL PUMP 1 % GEL (TESTOSTERONE) apply 10 grams once daily  #30 x 5   Entered and Authorized by:   Nelwyn Salisbury MD   Signed by:   Nelwyn Salisbury MD on 03/30/2009   Method used:   Print then Mail to Patient   RxID:   [id number] LAMISIL 250 MG TABS (TERBINAFINE HCL) once daily  #30 x 2   Entered and Authorized by:   Nelwyn Salisbury MD   Signed by:   Nelwyn Salisbury MD on 03/30/2009   Method used:   Electronically to        [facility]* (retail)       [address]       [address] Shipping/mailing       [place], [place]  [address]       Ph: [phone]       Fax: [phone]   RxID:   [id number]    Immunizations Administered:  Tetanus Vaccine:    Vaccine Type: Tdap    Site: left deltoid    Mfr: GlaxoSmithKline    Dose: 0.5 ml    Route: IM    Given  by: Raechel Ache, RN    Exp. Date: 11/08/2009    Lot #: [id number]    VIS given: 12/01/06 version given March 30, 2009.

## 2010-02-12 NOTE — Consult Note (Signed)
Summary: [facility]   Imported By: Lanelle Bal 06/21/2007 12:59:15  _____________________________________________________________________  External Attachment:    Type:   Image     Comment:   External Document

## 2010-02-12 NOTE — Consult Note (Signed)
Summary: [facility]   Imported By: Maryln Gottron 08/12/2007 13:59:28  _____________________________________________________________________  External Attachment:    Type:   Image     Comment:   External Document

## 2010-02-12 NOTE — Letter (Signed)
Summary: [facility]  [facility]   Imported By: Kassie Mends 09/07/2006 15:48:42  _____________________________________________________________________  External Attachment:    Type:   Image     Comment:   [facility] office note

## 2010-02-12 NOTE — Assessment & Plan Note (Signed)
Summary: cpx/njr   Vital Signs:  Patient profile:   40 year old male Height:      65.5 inches Weight:      146 pounds BMI:     24.01 Temp:     97.8 degrees F oral Pulse rate:   84 / minute Pulse rhythm:   regular BP sitting:   100 / 64  (left arm) Cuff size:   regular  Vitals Entered By: Alfred Levins, CMA (March 24, 2008 8:59 AM) CC: cpx   History of Present Illness: 40 yr old male for cpx.  Has a few concerns. He has chronic low back pain which mostly bothers him at night. Uses only Tylenol. 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 Used Ambien in the past with success.   Allergies (verified): No Known Drug Allergies  Past Medical History:    Reviewed history from 10/15/2006 and no changes required:       Prostatitis       Insomnia       Anxiety       GERD       Low back pain       Herpes Labialis       Onychomycosis  Past Surgical History:    Reviewed history from 03/09/2007 and no changes required:       Lumbar Disk Surgery       EGD, capsule endoscopy, and colonoscopy per Dr. Jeani Hawking 8-08, normal except some gastritis  Family History:    Reviewed history from 10/15/2006 and no changes required:       Family History of Arthritis       Family History Diabetes 1st degree relative  Social History:    Reviewed history from 10/15/2006 and no changes required:       Occupation:       Married       Current Smoker       Alcohol use-yes  Review of Systems  The patient denies anorexia, fever, weight loss, weight gain, vision loss, decreased hearing, hoarseness, chest pain, syncope, dyspnea on exertion, peripheral edema, prolonged cough, headaches, hemoptysis, abdominal pain, melena, hematochezia, severe indigestion/heartburn, hematuria, incontinence, genital sores, muscle weakness, suspicious skin lesions, transient blindness, difficulty walking, depression, unusual weight change, abnormal bleeding, enlarged lymph nodes, angioedema, and  testicular masses.    Physical Exam  General:  Well-developed,well-nourished,in no acute distress; alert,appropriate and cooperative throughout examination Head:  Normocephalic and atraumatic without obvious abnormalities. No apparent alopecia or balding. Eyes:  No corneal or conjunctival inflammation noted. EOMI. Perrla. Funduscopic exam benign, without hemorrhages, exudates or papilledema. Vision grossly normal. Ears:  External ear exam shows no significant lesions or deformities.  Otoscopic examination reveals clear canals, tympanic membranes are intact bilaterally without bulging, retraction, inflammation or discharge. Hearing is grossly normal bilaterally. Nose:  External nasal examination shows no deformity or inflammation. Nasal mucosa are pink and moist without lesions or exudates. Mouth:  Oral mucosa and oropharynx without lesions or exudates.  Teeth in good repair. Neck:  No deformities, masses, or tenderness noted. Chest Wall:  No deformities, masses, tenderness or gynecomastia noted. Lungs:  Normal respiratory effort, chest expands symmetrically. Lungs are clear to auscultation, no crackles or wheezes. Heart:  Normal rate and regular rhythm. S1 and S2 normal without gallop, murmur, click, rub or other extra sounds. Abdomen:  Bowel sounds positive,abdomen soft and non-tender without masses, organomegaly or hernias noted. Genitalia:  Testes bilaterally descended without nodularity, tenderness or masses. No  scrotal masses or lesions. No penis lesions or urethral discharge. Msk:  No deformity or scoliosis noted of thoracic or lumbar spine.   Pulses:  R and L carotid,radial,femoral,dorsalis pedis and posterior tibial pulses are full and equal bilaterally Extremities:  No clubbing, cyanosis, edema, or deformity noted with normal full range of motion of all joints.   Neurologic:  No cranial nerve deficits noted. Station and gait are normal. Plantar reflexes are down-going bilaterally. DTRs  are symmetrical throughout. Sensory, motor and coordinative functions appear intact. Skin:  Intact without suspicious lesions or rashes Cervical Nodes:  No lymphadenopathy noted Axillary Nodes:  No palpable lymphadenopathy Inguinal Nodes:  No significant adenopathy Psych:  Cognition and judgment appear intact. Alert and cooperative with normal attention span and concentration. No apparent delusions, illusions, hallucinations   Impression & Recommendations:  Problem # 1:  PHYSICAL EXAMINATION (ICD-V70.0)  Problem # 2:  LOW BACK PAIN (ICD-724.2)  The following medications were removed from the medication list:    Vicodin 5-500 Mg Tabs (Hydrocodone-acetaminophen) .Marland Kitchen... 1 every 6 hours as needed pain  Problem # 3:  INSOMNIA (ICD-780.52)  His updated medication list for this problem includes:    Zolpidem Tartrate 10 Mg Tabs (Zolpidem tartrate) .Marland Kitchen... At bedtime  Complete Medication List: 1)  Alprazolam 0.5 Mg Tabs (Alprazolam) .... Two times a day as needed for flying 2)  Zolpidem Tartrate 10 Mg Tabs (Zolpidem tartrate) .... At bedtime  Patient Instructions: 1)  Please schedule a follow-up appointment as needed . Try 800 mg of Motrin at bedtime for back pain.  Prescriptions: ALPRAZOLAM 0.5 MG  TABS (ALPRAZOLAM) two times a day as needed for flying  #60 x 2   Entered and Authorized by:   Nelwyn Salisbury MD   Signed by:   Nelwyn Salisbury MD on 03/24/2008   Method used:   Print then Give to Patient   RxID:   [id number] ZOLPIDEM TARTRATE 10 MG TABS (ZOLPIDEM TARTRATE) at bedtime  #30 x 5   Entered and Authorized by:   Nelwyn Salisbury MD   Signed by:   Nelwyn Salisbury MD on 03/24/2008   Method used:   Print then Give to Patient   RxID:   [id number]

## 2010-02-12 NOTE — Consult Note (Signed)
Summary: [facility] Elnor[facility]   Imported By: Maryln Gottron 03/19/2007 15:34:53  _____________________________________________________________________  External Attachment:    Type:   Image     Comment:   External Document

## 2010-02-12 NOTE — Assessment & Plan Note (Signed)
Summary: severe lower back pain/cjr   Vital Signs:  Patient profile:   40 year old male Weight:      138.5 pounds Temp:     98.3 degrees F oral Pulse rate:   82 / minute BP sitting:   104 / 80  (left arm) Cuff size:   regular  Vitals Entered By: Alfred Levins, CMA (October 19, 2008 10:35 AM) CC: fell down attic ladder yesterday and hurt back, fill out FMLA forms   History of Present Illness: Here with severe low back pain after a fall at home last night when a step ladder he was on broke. He was climbing into his attic when a step broke, causing him to fall to the ground. he has had sharp severe low back pains ever since depite taking Motrin and applying heat. The pain is at the same spot where he has had 2 spinal surgeries. No radiation of pain to the legs.   Allergies (verified): No Known Drug Allergies  Past History:  Past Medical History: Reviewed history from 10/15/2006 and no changes required. Prostatitis Insomnia Anxiety GERD Low back pain Herpes Labialis Onychomycosis  Past Surgical History: microdiskectomy twice on L5-S1, last time per Dr. Ophelia Charter in 06-2007 EGD, capsule endoscopy, and colonoscopy per Dr. Jeani Hawking 8-08, normal except some gastritis ESI twice to lumbar spine per Dr. Ophelia Charter 04-2007  Review of Systems  The patient denies anorexia, fever, weight loss, weight gain, vision loss, decreased hearing, hoarseness, chest pain, syncope, dyspnea on exertion, peripheral edema, prolonged cough, headaches, hemoptysis, abdominal pain, melena, hematochezia, severe indigestion/heartburn, hematuria, incontinence, genital sores, muscle weakness, suspicious skin lesions, transient blindness, difficulty walking, depression, unusual weight change, abnormal bleeding, enlarged lymph nodes, angioedema, breast masses, and testicular masses.    Physical Exam  General:  in pain, alert Msk:  very tender with a lot of spasm in the lower back right over his surgical scar. Limited  ROM, and SLR are positive.   Impression & Recommendations:  Problem # 1:  LOW BACK PAIN (ICD-724.2)  His updated medication list for this problem includes:    Flexeril 10 Mg Tabs (Cyclobenzaprine hcl) .Marland Kitchen... Three times a day as needed spasm    Vicodin Hp 10-660 Mg Tabs (Hydrocodone-acetaminophen) .Marland Kitchen... 1 q 6 hours as needed pain  Complete Medication List: 1)  Alprazolam 0.5 Mg Tabs (Alprazolam) .... Three times a day as needed anxiety 2)  Zolpidem Tartrate 10 Mg Tabs (Zolpidem tartrate) .... At bedtime 3)  Zoloft 50 Mg Tabs (Sertraline hcl) .... Once daily 4)  Flexeril 10 Mg Tabs (Cyclobenzaprine hcl) .... Three times a day as needed spasm 5)  Vicodin Hp 10-660 Mg Tabs (Hydrocodone-acetaminophen) .Marland Kitchen.. 1 q 6 hours as needed pain 6)  Prednisone (pak) 10 Mg Tabs (Prednisone) .... As directed for 12 days  Patient Instructions: 1)  rest, heat. We put him out of work today through 10-29-08. recheck with me in one week.  Prescriptions: PREDNISONE (PAK) 10 MG TABS (PREDNISONE) as directed for 12 days  #1 x 0   Entered and Authorized by:   Nelwyn Salisbury MD   Signed by:   Nelwyn Salisbury MD on 10/19/2008   Method used:   Print then Give to Patient   RxID:   [id number] VICODIN HP 10-660 MG TABS (HYDROCODONE-ACETAMINOPHEN) 1 q 6 hours as needed pain  #60 x 0   Entered and Authorized by:   Nelwyn Salisbury MD   Signed by:   Nelwyn Salisbury MD  on 10/19/2008   Method used:   Print then Give to Patient   RxID:   [id number] FLEXERIL 10 MG TABS (CYCLOBENZAPRINE HCL) three times a day as needed spasm  #60 x 2   Entered and Authorized by:   Nelwyn Salisbury MD   Signed by:   Nelwyn Salisbury MD on 10/19/2008   Method used:   Print then Give to Patient   RxID:   [id number]

## 2010-02-12 NOTE — Progress Notes (Signed)
Summary: refill Zolpidem  Phone Note From Pharmacy Message from:  Pharmacy on March 26, 2009 11:43 AM  Refills Requested: Medication #1:  ZOLPIDEM TARTRATE 10 MG TABS at bedtime   Last Refilled: 01/30/2009  Method Requested: Telephone to Pharmacy Initial call taken by: Raechel Ache, RN,  March 26, 2009 11:43 AM Caller: [facility]* Summary of Call: call in #30 with 5 rf Initial call taken by: Nelwyn Salisbury MD,  March 26, 2009 3:23 PM    Prescriptions: ZOLPIDEM TARTRATE 10 MG TABS (ZOLPIDEM TARTRATE) at bedtime  #30 x 5   Entered by:   Raechel Ache, RN   Authorized by:   Nelwyn Salisbury MD   Signed by:   Raechel Ache, RN on 03/26/2009   Method used:   Historical   RxID:   [id number]

## 2010-02-12 NOTE — Letter (Signed)
Summary: Lipid Letter  [place] [facility]  [address] [place], [place] [address]   Phone: [phone]  Fax: [phone]    02/19/2007  [facility] [address] [place], [place]  [address]  Dear James Bradley:  We have carefully reviewed your last lipid profile from 02/19/2007 and the results are noted below with a summary of recommendations for lipid management.    Cholesterol:       166     Goal: <   HDL "good" Cholesterol:   57.8     Goal: >   LDL "bad" Cholesterol:   78     Goal: <   Triglycerides:       63     Goal: <    All labs are normal    TLC Diet (Therapeutic Lifestyle Change): Saturated Fats & Transfatty acids should be kept < 7% of total calories ***Reduce Saturated Fats Polyunstaurated Fat can be up to 10% of total calories Monounsaturated Fat Fat can be up to 20% of total calories Total Fat should be no greater than 25-35% of total calories Carbohydrates should be 50-60% of total calories Protein should be approximately 15% of total calories Fiber should be at least 20-30 grams a day ***Increased fiber may help lower LDL Total Cholesterol should be < 200mg /day Consider adding plant stanol/sterols to diet (example: Benacol spread) ***A higher intake of unsaturated fat may reduce Triglycerides and Increase HDL    Adjunctive Measures (may lower LIPIDS and reduce risk of Heart Attack) include: Aerobic Exercise (20-30 minutes 3-4 times a week) Limit Alcohol Consumption Weight Reduction Aspirin 75-81 mg a day by mouth (if not allergic or contraindicated) Vitamin E 400 IU a day by mouth Folic Acid 1mg  a day by mouth Dietary Fiber 20-30 grams a day by mouth   If you have any questions, please call. We appreciate being able to work with you.   Sincerely,    [place] [facility]

## 2010-02-12 NOTE — Progress Notes (Signed)
Summary: refill  Phone Note Refill Request Message from:  Pharmacy on April 03, 2009 4:54 PM  Refills Requested: Medication #1:  ALPRAZOLAM 0.5 MG  TABS three times a day as needed anxiety   Dosage confirmed as above?Dosage Confirmed   Supply Requested: 1 month   Last Refilled: 01/30/2009  Method Requested: Fax to Local Pharmacy Initial call taken by: Raechel Ache, RN,  April 03, 2009 4:55 PM  Follow-up for Phone Call        call in #90 with 5 rf Follow-up by: Nelwyn Salisbury MD,  April 03, 2009 5:23 PM    Prescriptions: ALPRAZOLAM 0.5 MG  TABS (ALPRAZOLAM) three times a day as needed anxiety  #90 x 5   Entered by:   Raechel Ache, RN   Authorized by:   Nelwyn Salisbury MD   Signed by:   Raechel Ache, RN on 04/03/2009   Method used:   Historical   RxID:   [id number]

## 2010-03-29 ENCOUNTER — Other Ambulatory Visit: Payer: 59

## 2010-03-29 ENCOUNTER — Encounter: Payer: Self-pay | Admitting: Family Medicine

## 2010-03-29 DIAGNOSIS — Z0000 Encounter for general adult medical examination without abnormal findings: Secondary | ICD-10-CM

## 2010-03-29 LAB — POCT URINALYSIS DIPSTICK
Bilirubin, UA: NEGATIVE
Blood, UA: NEGATIVE
Glucose, UA: NEGATIVE
Ketones, UA: NEGATIVE
Leukocytes, UA: NEGATIVE
Nitrite, UA: NEGATIVE
Protein, UA: NEGATIVE
Spec Grav, UA: 1.01
Urobilinogen, UA: 0.2
pH, UA: 5.5

## 2010-03-29 LAB — CBC WITH DIFFERENTIAL/PLATELET
Basophils Absolute: 0.1 10*3/uL (ref 0.0–0.1)
Basophils Relative: 0.8 % (ref 0.0–3.0)
Eosinophils Absolute: 0.3 10*3/uL (ref 0.0–0.7)
Eosinophils Relative: 3.5 % (ref 0.0–5.0)
HCT: 39.2 % (ref 39.0–52.0)
Hemoglobin: 12.9 g/dL — ABNORMAL LOW (ref 13.0–17.0)
Lymphocytes Relative: 49.6 % — ABNORMAL HIGH (ref 12.0–46.0)
Lymphs Abs: 3.6 10*3/uL (ref 0.7–4.0)
MCHC: 32.9 g/dL (ref 30.0–36.0)
MCV: 73.3 fl — ABNORMAL LOW (ref 78.0–100.0)
Monocytes Absolute: 0.5 10*3/uL (ref 0.1–1.0)
Monocytes Relative: 7.3 % (ref 3.0–12.0)
Neutro Abs: 2.8 10*3/uL (ref 1.4–7.7)
Neutrophils Relative %: 38.8 % — ABNORMAL LOW (ref 43.0–77.0)
Platelets: 250 10*3/uL (ref 150.0–400.0)
RBC: 5.35 Mil/uL (ref 4.22–5.81)
RDW: 16.6 % — ABNORMAL HIGH (ref 11.5–14.6)
WBC: 7.2 10*3/uL (ref 4.5–10.5)

## 2010-03-29 LAB — BASIC METABOLIC PANEL
BUN: 16 mg/dL (ref 6–23)
CO2: 28 mEq/L (ref 19–32)
Calcium: 9.3 mg/dL (ref 8.4–10.5)
Chloride: 102 mEq/L (ref 96–112)
Creatinine, Ser: 0.9 mg/dL (ref 0.4–1.5)
GFR: 105.44 mL/min (ref >60.00)
Glucose, Bld: 81 mg/dL (ref 70–99)
Potassium: 4.1 mEq/L (ref 3.5–5.1)
Sodium: 138 mEq/L (ref 135–145)

## 2010-03-29 LAB — HEPATIC FUNCTION PANEL
ALT: 31 U/L (ref 0–53)
AST: 30 U/L (ref 0–37)
Albumin: 4.8 g/dL (ref 3.5–5.2)
Alkaline Phosphatase: 45 U/L (ref 39–117)
Bilirubin, Direct: 0.1 mg/dL (ref 0.0–0.3)
Total Bilirubin: 0.8 mg/dL (ref 0.3–1.2)
Total Protein: 7.1 g/dL (ref 6.0–8.3)

## 2010-03-29 LAB — LIPID PANEL
Cholesterol: 189 mg/dL (ref 0–200)
HDL: 87.6 mg/dL (ref >39.00)
LDL Cholesterol: 76 mg/dL (ref 0–99)
Total CHOL/HDL Ratio: 2
Triglycerides: 128 mg/dL (ref 0.0–149.0)
VLDL: 25.6 mg/dL (ref 0.0–40.0)

## 2010-03-29 LAB — PSA: PSA: 4.98 ng/mL — ABNORMAL HIGH (ref 0.10–4.00)

## 2010-03-29 LAB — TSH: TSH: 1.51 u[IU]/mL (ref 0.35–5.50)

## 2010-04-08 ENCOUNTER — Telehealth: Payer: Self-pay

## 2010-04-08 DIAGNOSIS — R972 Elevated prostate specific antigen [PSA]: Secondary | ICD-10-CM

## 2010-04-08 NOTE — Telephone Encounter (Signed)
Message copied by Madison Hickman on Mon Apr 08, 2010  1:48 PM ------      Message from: Dwaine Deter      Created: Fri Apr 05, 2010  5:31 PM       Normal except his PSA is a little high (one point higher than last year). Refer to Urology

## 2010-04-08 NOTE — Telephone Encounter (Signed)
Spoke with pt results given referral to urology to [facility] Pt stated has appt tomorrow 04-09-10 with dr fry

## 2010-04-09 ENCOUNTER — Encounter: Payer: Self-pay | Admitting: Family Medicine

## 2010-04-09 ENCOUNTER — Ambulatory Visit (INDEPENDENT_AMBULATORY_CARE_PROVIDER_SITE_OTHER): Payer: 59 | Admitting: Family Medicine

## 2010-04-09 VITALS — BP 106/62 | HR 84 | Ht 64.0 in | Wt 144.0 lb

## 2010-04-09 DIAGNOSIS — Z0000 Encounter for general adult medical examination without abnormal findings: Secondary | ICD-10-CM

## 2010-04-09 MED ORDER — ALPRAZOLAM 0.5 MG PO TABS: 0.5000 mg | ORAL_TABLET | Freq: Three times a day (TID) | ORAL | Status: DC | PRN

## 2010-04-09 MED ORDER — ZOLPIDEM TARTRATE 10 MG PO TABS: 10.0000 mg | ORAL_TABLET | Freq: Every evening | ORAL | Status: DC | PRN

## 2010-04-09 MED ORDER — DOXYCYCLINE HYCLATE 100 MG PO CAPS: 100.0000 mg | ORAL_CAPSULE | Freq: Two times a day (BID) | ORAL | Status: AC

## 2010-04-09 MED ORDER — HYDROCODONE-ACETAMINOPHEN 5-500 MG PO TABS: 1.0000 | ORAL_TABLET | Freq: Four times a day (QID) | ORAL | Status: AC | PRN

## 2010-04-09 NOTE — Progress Notes (Signed)
  Subjective:    Patient ID: James Bradley, male    DOB: 1970/04/11, 40 y.o.   MRN: [id number]  HPI 40 yr old male for a cpx. His only complaints are of one week of lower abdominal cramps, low back pain, and urgency to urinate. No fever. This is very similar to what he felt when he had a prostate infection last year. Of note his recent labs showed that his PSA has jumped a full point from last year, up to almost 5.    Review of Systems  Constitutional: Negative.   HENT: Negative.   Eyes: Negative.   Respiratory: Negative.   Cardiovascular: Negative.   Gastrointestinal: Negative.   Genitourinary: Positive for urgency and difficulty urinating. Negative for dysuria, frequency, hematuria, flank pain, decreased urine volume, discharge, penile swelling, scrotal swelling, enuresis, genital sores, penile pain and testicular pain.  Musculoskeletal: Negative.   Skin: Negative.   Neurological: Negative.   Hematological: Negative.   Psychiatric/Behavioral: Negative.        Objective:   Physical Exam  Constitutional: He is oriented to person, place, and time. He appears well-developed and well-nourished. No distress.  HENT:  Head: Normocephalic and atraumatic.  Right Ear: External ear normal.  Left Ear: External ear normal.  Nose: Nose normal.  Mouth/Throat: Oropharynx is clear and moist. No oropharyngeal exudate.  Eyes: Conjunctivae and EOM are normal. Pupils are equal, round, and reactive to light. Right eye exhibits no discharge. Left eye exhibits no discharge. No scleral icterus.  Neck: Neck supple. No JVD present. No tracheal deviation present. No thyromegaly present.  Cardiovascular: Normal rate, regular rhythm, normal heart sounds and intact distal pulses.  Exam reveals no gallop and no friction rub.   No murmur heard. Pulmonary/Chest: Effort normal and breath sounds normal. No respiratory distress. He has no wheezes. He has no rales. He exhibits no tenderness.  Abdominal: Soft.  Bowel sounds are normal. He exhibits no distension and no mass. There is no tenderness. There is no rebound and no guarding.  Genitourinary: Rectum normal and penis normal. No penile tenderness.       Prostate is 2+, boggy, and a bit tender   Musculoskeletal: Normal range of motion. He exhibits no edema and no tenderness.  Lymphadenopathy:    He has no cervical adenopathy.  Neurological: He is alert and oriented to person, place, and time. He has normal reflexes. No cranial nerve deficit. He exhibits normal muscle tone. Coordination normal.  Skin: Skin is warm and dry. No rash noted. He is not diaphoretic. No erythema. No pallor.  Psychiatric: He has a normal mood and affect. His behavior is normal. Judgment and thought content normal.          Assessment & Plan:  He has an acute prostatitis, and I explained that this could certainly explain the jump in his PSA. We will treat this with 30 days of antibiotics, then wait another another 30 days. At that time we will repeat a PSA. If it remains elevated , we will refer him to Urology.

## 2010-05-28 NOTE — Assessment & Plan Note (Signed)
[facility]                            [facility] OFFICE NOTE   PHARELL, ROLFSON                      MRN:          [id number]  DATE:07/23/2006                            DOB:          May 23, 1970    This is a 40 year old gentleman here for a complete physical examination  who has a couple of things to discuss today. I saw him on June 17 when  he had symptoms consistent with a prostate infection. He had burning on  urination, increased frequency and urgency of urination, decreased force  of urinary stream, and mild achy pains in the low back and in the pubic  region. He was started on Cipro 500 mg b.i.d. at that time and has  completed about three weeks of therapy. He was also taking Vicodin as  needed for discomfort and drinking plenty of water. He feels a little  better than he did, but he still has some of these symptoms persisting.  Also, I treated him for acid reflux a couple of years. This is something  that bothers him for time to time. He usually manages it with over-the-  counter agents. Now over the past several weeks, he has had increasing  epigastric burning type pains and heartburn. Also, at our last visit, we  started Paxil to help with premature ejaculation. He is very pleased  with how this is working. For other details of his past medical history,  family history, social history, habits, etcetera, I refer you to our  introductory note with him dated March 07, 2003.   ALLERGIES:  None.   CURRENT MEDICATIONS:  1. Paxil 20 mg daily.  2. Xanax 0.5 mg b.i.d. on an as needed basis for anxiety associated      with flying.  3. Cipro 500 mg b.i.d.  4. Vicodin 5/500 mg on an as needed basis.   OBJECTIVE:  VITAL SIGNS:  Height 5 foot 5 inches, weight 139, blood  pressure 102/72, pulse 70 and regular, temperature 98.5 degrees.  GENERAL:  He appears healthy and is in no acute distress.  SKIN:  Clear.  EYES:  Clear.  EARS:  Clear.  OROPHARYNX:  Clear.  NECK:  Supple without lymphadenopathy or masses.  LUNGS:  Clear.  CARDIAC:  Regular rate and rhythm with no murmurs, rubs, or gallops.  Distal pulses are full.  ABDOMEN:  Soft, normal bowel sounds, nontender, no masses.  GENITALIA:  Normal male. He is circumcised. No tenderness is noted.  RECTAL:  I did not repeat a rectal exam today since we did this several  weeks ago.  EXTREMITIES:  No cyanosis, clubbing, or edema.  NEUROLOGIC:  Grossly intact.   He was here for fasting labs on July 7 and these were all within normal  limits.   ASSESSMENT AND PLAN:  1. Complete physical exam. In general, he seems to be doing well. I      encouraged him to get regular exercise.  2. Prostatitis which has been partially treated. We will stop Cipro      and switch to Doxycycline 100 mg to take b.i.d.  for 30 days. He      will follow up as needed.  3. Gastroesophageal reflux disease. I gave him samples of both Aciphex      20 mg and Nexium 40 mg to take once daily over the next month or      so.  4. Premature ejaculation, well managed.  5. Travel anxiety, well managed.     Tera Mater. Clent Ridges, MD  Electronically Signed    SAF/MedQ  DD: 07/23/2006  DT: 07/24/2006  Job #: [id number]

## 2010-05-28 NOTE — Op Note (Signed)
James Bradley, James Bradley               ACCOUNT NO.:  [id number]   MEDICAL RECORD NO.:  [id number]          PATIENT TYPE:  OIB   LOCATION:  5037                         FACILITY:  [facility]   PHYSICIAN:  Mark C. Ophelia Charter, M.D.    DATE OF BIRTH:  08/27/1970   DATE OF PROCEDURE:  DATE OF DISCHARGE:                               OPERATIVE REPORT   PREOPERATIVE DIAGNOSIS:  Recurrent herniated nucleus pulposus, right L5-  S1.   POSTOPERATIVE DIAGNOSIS:  Recurrent herniated nucleus pulposus, right L5-  S1.   PROCEDURE:  Right L5-S1 microdiskectomy for recurrent herniated nucleus  pulposus, foraminotomy.   SURGEON:  Mark C. Ophelia Charter, MD.   ASSISTANT:  Wende Neighbors, PA-C   ANESTHESIA:  GOT plus Marcaine local.   ESTIMATED BLOOD LOSS:  Minimal.   A 40 year old male 4-5 years post microdiskectomy with 6 months  progressive history of right leg pain with foot numbness, tension signs,  and gastroc-soleus weakness.  MRI scan shows a recurrent disk with scar  tissue and foraminal compression.   PROCEDURE:  After induction of general anesthesia, the patient was  placed on Andrews frame kneeling position.  Careful padding,  positioning, back was prepped with DuraPrep, area squared with towels,  Betadine binder applied after sterile skin marker outlined the old scar  incision which was a subcuticular closure, and application of a  laminectomy sheet.  Time-out procedure check list was completed and  Ancef was given.  Cross-table lateral x-ray with the needle showed that  needle was slightly below the L5-S1 disk space.  Old scar was opened.  Blunt dissection down with Bovie electrocautery to the lamina was  performed.  Cobb was used laterally to clean off soft tissue and self-  retaining retractor was placed.  Some additional lamina was taken on the  right side and a portion of the S1 lamina was taken. On the right side  nerve root was identified.  It was entrapped with scar tissue laterally,  dissected loose and there was disk causing compression and dorsal  displacement of the nerve root as it was stuck down the scar tissue.  Dense scar tissue was released using the scalpel blade with  microdissection techniques using the operative microscope.  Tissue was  peeled and then scalpel was used to cut scar tissue releasing the nerve  root.  The disk was entered with 15 scalpel blade, passes were made, and  a small amount of disk was removed.  A narrow pituitary micro would only  fit in the disk space and regular pituitary would not fit due to some  clamps at disk space height.  Scar tissue was continued to be released  and there was a lot of disk material on the right side lateral and just  superior to the level of the disk space.  There was some overhanging  spur off the facet and this was trimmed back as well and once this was  removed, nerve root was free and finally a ball-tip nerve hook and then  hockey stick could be passed anterior to the dura with good freedom.  Nerve root  was followed out to foramina and bone was removed  until the nerve root was free both laterally, dorsally, as well as  ventrally.  The disk space was irrigated.  After that, deep fascia was  closed with 0 Vicryl, 2-0 Vicryl subcutaneous tissue, 4-0 Vicryl  subcuticular closure, tincture of Benzoin, Steri-Strips, 4x4s, and  dressing.        Mark C. Ophelia Charter, M.D.  Electronically Signed     MCY/MEDQ  D:  07/07/2007  T:  07/08/2007  Job:  [id number]

## 2010-05-31 NOTE — Op Note (Signed)
[place]  Patient:    James Bradley, James Bradley Visit Number: [id number] MRN: [id number]          Service Type: SUR Location: [phone] Attending Physician:  Jacki Cones Dictated by:   Veverly Fells Ophelia Charter, M.D. Proc. Date: 07/21/01 Admit Date:  07/21/2001 Discharge Date: 07/22/2001                             Operative Report  PREOPERATIVE DIAGNOSIS:  L5-S1 herniated nucleus pulposus, right.  POSTOPERATIVE DIAGNOSIS:  L5-S1 herniated nucleus pulposus, right.  PROCEDURE:  Right L5-S1 microdiskectomy for herniated nucleus pulposus.  SURGEON:  Mark C. Ophelia Charter, M.D.  ASSISTANT:  Zonia Kief, P.A.-C.  ANESTHESIA:  General.  ESTIMATED BLOOD LOSS:  Less than 100 ml.  DESCRIPTION OF PROCEDURE:  After induction of a general anesthesia, preoperative Ancef prophylaxis, the patient was placed prone on the Andrews frame in a kneeling position with careful padding and positioning.  The back was prepped with Duraprep, scored with towels the usual, Betadine Vi-Drape was applied, laminectomy sheet, needle localization with a spinal needle, which was placed at the L5-S1 level and a plain radiograph was taken showing that it was just inferior to the level of the disk.  An incision was made 1 or 2 mm off to the right side.  Subperiosteal dissection on the lamina was performed. Taylor retractor was placed laterally.  The lamina was partially removed on the right side performing a laminotomy.  Bone was removed out to the level of the facet, as well as the ligamentum.  The foramen was enlarged dorsally. Nerve root and dural sac was displaced dorsally and centrally, and the disk was hard and bulging, and more prominent than on the previous MRI, which corresponded with the patients significant increase in symptoms with pain and nerve root tension signs.  Soft tissue was cleaned off the annulus.  A stab incision was made with a 15 scalpel blade and several large  shunts of degenerative disk was removed with the pituitary.  Epstein curets were used to deliver the tissue from underneath the annulus from the midline out to the right side and then it was removed with pituitary.  A portion of the annulus was very firm, rolled up, and had some mild endplate spurs making a hard ridge, which was removed using the Kerrison rongeur, as well as a straight pituitary.  The nerve root was gently mobilized.  There was no disk material in the axilla or dorsal.  A hockey-stick was placed out the foramen all around the nerve root and it was extremely loose.  Passes were made and palpation with a nerve hook with no free fragments present in the canal.  Hockey-stick was run through a 180 degree sweep anterior to the dura with no areas of further compression.  With the long hockey-stick the opposite pedicle was palpated and there was satisfactory decompression.  A few remaining passes were made with up, down, and straight pituitaries.  Irrigation was used in the disk space.  DErrico was removed, and the fascia was repaired with 0 Vicryl figure-of-eight sutures, 2-0 Vicryl in the subcutaneous tissue, skin staple closure.  Marcaine infiltration in the skin and the standard postoperative dressing with tape.  Instrument count and needle count was correct. Dictated by:   Veverly Fells Ophelia Charter, M.D. Attending Physician:  Jacki Cones DD:  07/21/01 TD:  07/24/01 Job: [id number]

## 2010-08-09 ENCOUNTER — Other Ambulatory Visit: Payer: Self-pay | Admitting: Urology

## 2010-08-09 ENCOUNTER — Encounter (HOSPITAL_COMMUNITY): Payer: 59

## 2010-08-09 LAB — BASIC METABOLIC PANEL
BUN: 16 mg/dL (ref 6–23)
CO2: 28 mEq/L (ref 19–32)
Calcium: 9.7 mg/dL (ref 8.4–10.5)
Chloride: 103 mEq/L (ref 96–112)
Creatinine, Ser: 0.87 mg/dL (ref 0.50–1.35)
GFR calc Af Amer: >60 mL/min (ref >60)
GFR calc non Af Amer: >60 mL/min (ref >60)
Glucose, Bld: 88 mg/dL (ref 70–99)
Potassium: 4 mEq/L (ref 3.5–5.1)
Sodium: 139 mEq/L (ref 135–145)

## 2010-08-09 LAB — SURGICAL PCR SCREEN
MRSA, PCR: NEGATIVE
Staphylococcus aureus: NEGATIVE

## 2010-08-09 LAB — CBC
HCT: 39.1 % (ref 39.0–52.0)
Hemoglobin: 12.6 g/dL — ABNORMAL LOW (ref 13.0–17.0)
MCH: 22.7 pg — ABNORMAL LOW (ref 26.0–34.0)
MCHC: 32.2 g/dL (ref 30.0–36.0)
MCV: 70.3 fL — ABNORMAL LOW (ref 78.0–100.0)
Platelets: 251 10*3/uL (ref 150–400)
RBC: 5.56 MIL/uL (ref 4.22–5.81)
RDW: 16.6 % — ABNORMAL HIGH (ref 11.5–15.5)
WBC: 6.4 10*3/uL (ref 4.0–10.5)

## 2010-08-20 ENCOUNTER — Other Ambulatory Visit: Payer: Self-pay | Admitting: Urology

## 2010-08-20 ENCOUNTER — Inpatient Hospital Stay (HOSPITAL_COMMUNITY)
Admission: RE | Admit: 2010-08-20 | Discharge: 2010-08-21 | DRG: 708 | Disposition: A | Discharge status: Home / Self Care | Payer: 59 | Source: Ambulatory Visit | Attending: Urology | Admitting: Urology

## 2010-08-20 DIAGNOSIS — F17200 Nicotine dependence, unspecified, uncomplicated: Secondary | ICD-10-CM | POA: Diagnosis present

## 2010-08-20 DIAGNOSIS — Z01812 Encounter for preprocedural laboratory examination: Secondary | ICD-10-CM

## 2010-08-20 DIAGNOSIS — C61 Malignant neoplasm of prostate: Principal | ICD-10-CM | POA: Diagnosis present

## 2010-08-20 HISTORY — PX: PROSTATECTOMY: SHX69

## 2010-08-20 LAB — TYPE AND SCREEN
ABO/RH(D): B POS
Antibody Screen: NEGATIVE

## 2010-08-20 LAB — HEMOGLOBIN AND HEMATOCRIT, BLOOD
HCT: 40.5 % (ref 39.0–52.0)
Hemoglobin: 13.1 g/dL (ref 13.0–17.0)

## 2010-08-20 LAB — ABO/RH: ABO/RH(D): B POS

## 2010-08-21 LAB — HEMOGLOBIN AND HEMATOCRIT, BLOOD
HCT: 34.9 % — ABNORMAL LOW (ref 39.0–52.0)
Hemoglobin: 11.3 g/dL — ABNORMAL LOW (ref 13.0–17.0)

## 2010-08-21 NOTE — Op Note (Signed)
NAMECLEVLAND, CORK               ACCOUNT NO.:  [id number]  MEDICAL RECORD NO.:  [id number]  LOCATION:  1425                         FACILITY:  [facility]  PHYSICIAN:  Excell Seltzer. Annabell Howells, M.D.    DATE OF BIRTH:  [birth year]  DATE OF PROCEDURE:  08/20/2010 DATE OF DISCHARGE:                              OPERATIVE REPORT   PROCEDURE:  Robot-assisted laparoscopic radical prostatectomy with nerve sparing.  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  POSTOPERATIVE DIAGNOSIS:  T1c Nx Mx Gleason 6 adenocarcinoma of the prostate.  SURGEON:  Excell Seltzer. Annabell Howells, M.D.  ASSISTANT:  Delia Chimes, NP  ANESTHESIA:  General.  DRAINS:  19-French Blake drain and 20-French coude Foley catheter.  BLOOD LOSS:  70 cc.  SPECIMENS:  Prostate and seminal vesicles.  COMPLICATIONS:  None.  INDICATIONS:  Mr. Donaldson is a 40 year old African American male who was referred for a PSA of 4.59.  He underwent a biopsy, which revealed a Gleason 6 adenocarcinoma of the prostate involving 5 of 12 cores, but all 4 apical cores were positive.  He did not need indications for metastatic workup, so stage is T1c Nx Mx.  His preoperative prostate symptom score was 33 and he did have mild-to-moderate erectile dysfunction.  FINDINGS OF THE PROCEDURE:  Coran was taken to the operating room where he was given a gram of Ancef and fitted with PAS hose.  A general anesthetic was induced and he was placed in the low lithotomy position. His abdomen was clipped.  A red rubber rectal catheter was placed and was secured to an aseptic syringe, which was taped to his left side.  He was then prepped with Betadine solution on the genitalia and ChloraPrep on the abdomen.  After an appropriate 3 minute drying time, he was placed in the steep Trendelenburg position per routine with a Mayo stand over his head.  He was then draped in the usual sterile fashion and the drape was secured with an OpSite.  A  20-French Foley catheter was inserted.  Balloon was filled with 30 cc of sterile fluid.  The bladder was drained and the catheter was connected to an irrigation set.  At this point, the video port was marked 18 cm superior to the pubis just above the umbilicus in the midline and a 2 cm incision was made with a knife.  This was carried down through the subcutaneous tissue with [employer] and hemostat.  The anterior rectus fascia was incised with the Bovie and the peritoneum was then punctured with the hemostat. The 12 mm port was placed and video endoscope was inserted.  Inspection revealed no adhesions within the pelvis.  The incision was tightened around the port with 0-Vicryl suture.  The remaining port sites were then placed in the standard fashion with two 8 mm robot ports on the left and 8 mm robot port on the right medial abdominal wall, a 12 mm assistant port on the right lateral abdominal wall, and a 5 mm assistant port on the right superior medial abdominal wall.  Once all ports were in position, the robot, which had been draped prior to the procedure was brought onto the  field and docked.  Cautery scissors were placed in the right robot arm, a ProGrasp was placed in the left lateral arm, and a PK dissector in the right medial arm.  At this point, the dissection was initiated by incising the peritoneum transversely superior to the bladder.  The obliterated umbilical arteries were divided with cautery and the bladder, which had been filled with fluid to aid identification was dropped away from the anterior abdominal wall exposing the pubis.  The bladder was then drained and the bladder was placed on cephalad traction.  The anterior prostate was defatted.  The right endopelvic fascia was incised and lateral plane was developed.  The left endopelvic fascia was incised and the lateral plane was developed.  Once the dorsal vein had then dissected out on both sides and Endo-GIA was  then used to divide the dorsal vein complex.  Once the dorsal vein complex had been divided, the patient was given indigo carmine and the bladder neck was incised transversely.  The Foley was identified, brought out on the field, and used to provide anterior traction.  The posterior bladder neck was then divided and with care being taken to avoid ureteral orifices.  The structures were then identified.  The right ampulla of vas was dissected out, divided, and used fat anterior traction.  This was followed by dissection of the right seminal vesicle then the left vasal ampule and the left seminal vesicle.  The structures dissected out very easily with no significant bleeding or fibrosis.  Once the structures were elevated, the posterior Denonvilliers fascia was incised in the plane between the prostate and rectum was developed without difficulty.  The prostate was then reflected to the left and the periprostatic fascia was incised and the right neurovascular bundle was dissected off the prostate laterally.  Once a sufficient nerve-sparing dissection had been performed, the right pedicle was taken down using the Enfield device and residual lateral attachments were taken down out to the apex using sharp and blunt dissection.  The prostate was then reflected to the right and the left periprostatic fascia was incised and neurovascular bundle was dissected away from the prostate and the left pedicle was then taken down with the MCL and the residual lateral attachments were taken down to the apex using sharp and blunt dissection.  Once the nerve spare had been completed and the pedicle was taken down, we turned our attention to the urethra.  The residual dorsal vein complex was divided with a cautery.  The urethra was divided with cold shears. Some residual rectourethral attachments were taken down sharply as well.  At this point, the prostate was moved out of the pelvis and the pelvis was  irrigated and insufflated with no evidence of rectal injury.  There was minimal oozing from the periurethral area, but he did not require immediate attention.  A 2-0 Vicryl suture was then used to reapproximate the posterior bladder neck and posterior urethra using a slip-knot.  Once this was secured, a black-colored 4-0 Monocryl was used to complete a running vesicourethral anastomosis.  The initial surgeon's knot was placed, a fresh 20-French to a Foley catheter was inserted.  Balloon was filled with 15 cc of sterile fluid and the catheter was irrigated with no evidence of anastomotic leak.  The anastomotic suture tie was then completed.  At this point, FloSeal was used to control some mild residual periurethral bleeding, 5 cc was used.  The prostate was then moved back into the pelvis and grasped  with the assistant grasper.  The robot was then undocked and the prostate was placed in entrapment sac through the video port.  The 12 mm assistant port was then removed and the port site was closed with a 0-Vicryl suture using a needle passer.  Prior to remove the ports, 19-French Harrison Mons drain had been placed through the left lateral robot port, which had then been removed.  The remaining port sites were removed under vision guidance with no evidence of bleeding.  Finally, the video port was removed and the port site was extended to allow removal of the prostate.  Once the specimen had been removed, the anterior rectus fascia was closed using a running 0-Vicryl suture.  The port sites were re-infiltrated with quarter percent Marcaine.  After initial infiltration, during the placement process, the port sites were closed with skin staples and the drain was secured with a nylon suture. The Foley catheter was irrigated once again with blood tinged return and placed to straight drainage.  The patient's wounds were dressed.  His anesthetic was reversed.  He was admitted to recovery room in  stable condition.  There were no complications.     Excell Seltzer. Annabell Howells, M.D.     JJW/MEDQ  D:  08/20/2010  T:  08/21/2010  Job:  [id number]  cc:   Bertram Millard. Dahlstedt, M.D. Fax: [phone]  Tera Mater. Clent Ridges, MD [address] [place] [address]  Electronically Signed by Bjorn Pippin M.D. on 08/21/2010 10:30:15 AM

## 2010-10-03 ENCOUNTER — Other Ambulatory Visit: Payer: Self-pay | Admitting: Family Medicine

## 2010-10-04 NOTE — Telephone Encounter (Signed)
Call in 6 months of both

## 2010-10-04 NOTE — Telephone Encounter (Signed)
Called in both scripts

## 2010-10-04 NOTE — Telephone Encounter (Signed)
Pt last seen 3.27.12. Pls advise.

## 2010-10-10 LAB — DIFFERENTIAL
Basophils Absolute: 0
Basophils Relative: 0
Eosinophils Absolute: 0
Eosinophils Relative: 1
Lymphocytes Relative: 44
Lymphs Abs: 3.4
Monocytes Absolute: 0.5
Monocytes Relative: 7
Neutro Abs: 3.7
Neutrophils Relative %: 48

## 2010-10-10 LAB — COMPREHENSIVE METABOLIC PANEL
ALT: 42
AST: 41 — ABNORMAL HIGH
Albumin: 4.4
Alkaline Phosphatase: 50
BUN: 13
CO2: 28
Calcium: 9.8
Chloride: 103
Creatinine, Ser: 0.86
GFR calc Af Amer: >60
GFR calc non Af Amer: >60
Glucose, Bld: 77
Potassium: 4.4
Sodium: 138
Total Bilirubin: 1.2
Total Protein: 6.5

## 2010-10-10 LAB — URINALYSIS, ROUTINE W REFLEX MICROSCOPIC
Glucose, UA: NEGATIVE
Hgb urine dipstick: NEGATIVE
Ketones, ur: NEGATIVE
Nitrite: NEGATIVE
Protein, ur: NEGATIVE
Specific Gravity, Urine: 1.028
Urobilinogen, UA: 1
pH: 5.5

## 2010-10-10 LAB — CBC
HCT: 40.3
Hemoglobin: 13.6
MCHC: 33.9
MCV: 73 — ABNORMAL LOW
Platelets: 301
RBC: 5.51
RDW: 17.7 — ABNORMAL HIGH
WBC: 7.7

## 2010-10-10 LAB — PROTIME-INR
INR: 0.9
Prothrombin Time: 11.9

## 2010-10-10 LAB — APTT: aPTT: 25

## 2010-10-14 ENCOUNTER — Ambulatory Visit (INDEPENDENT_AMBULATORY_CARE_PROVIDER_SITE_OTHER): Payer: 59 | Admitting: Family Medicine

## 2010-10-14 ENCOUNTER — Encounter: Payer: Self-pay | Admitting: Family Medicine

## 2010-10-14 VITALS — BP 118/68 | HR 98 | Temp 98.8°F | Wt 150.0 lb

## 2010-10-14 DIAGNOSIS — N529 Male erectile dysfunction, unspecified: Secondary | ICD-10-CM

## 2010-10-14 DIAGNOSIS — N390 Urinary tract infection, site not specified: Secondary | ICD-10-CM

## 2010-10-14 DIAGNOSIS — Z8546 Personal history of malignant neoplasm of prostate: Secondary | ICD-10-CM

## 2010-10-14 DIAGNOSIS — M545 Low back pain, unspecified: Secondary | ICD-10-CM

## 2010-10-14 LAB — POCT URINALYSIS DIPSTICK
Blood, UA: NEGATIVE
Glucose, UA: NEGATIVE
Ketones, UA: NEGATIVE
Nitrite, UA: NEGATIVE
Spec Grav, UA: 1.03
Urobilinogen, UA: 0.2
pH, UA: 5

## 2010-10-14 MED ORDER — TADALAFIL 20 MG PO TABS: 20.0000 mg | ORAL_TABLET | Freq: Every day | ORAL | Status: DC | PRN

## 2010-10-14 MED ORDER — OXYCODONE-ACETAMINOPHEN 10-325 MG PO TABS: 1.0000 | ORAL_TABLET | Freq: Four times a day (QID) | ORAL | Status: DC | PRN

## 2010-10-14 MED ORDER — CIPROFLOXACIN HCL 500 MG PO TABS: 500.0000 mg | ORAL_TABLET | Freq: Two times a day (BID) | ORAL | Status: AC

## 2010-10-14 NOTE — Progress Notes (Signed)
  Subjective:    Patient ID: James Bradley, male    DOB: Jun 10, 1970, 40 y.o.   MRN: [id number]  HPI Here for a possible UTI, for ED, and for continued perineal pain after a radical prostatectomy in August. He has done well, but ED has been a persistent problem. He tried daily 5 mg Cialis but this has not helped. He has been using Vicodin for pain with poor results. For the past week he has had some burning and urgency to urinate. No fever.   Review of Systems  Constitutional: Negative.   Respiratory: Negative.   Cardiovascular: Negative.   Gastrointestinal: Negative.   Genitourinary: Positive for dysuria, urgency and frequency.  Musculoskeletal: Positive for back pain.       Objective:   Physical Exam  Constitutional: He appears well-developed and well-nourished.  Abdominal: Soft. Bowel sounds are normal. He exhibits no distension and no mass. There is no tenderness. There is no rebound and no guarding.          Assessment & Plan:  Treat the UTI with Cipro. Culture the urine. Use Percocet for pain. Try samples of 20 mg Cialis.

## 2010-10-15 LAB — URINE CULTURE
Colony Count: NO GROWTH
Organism ID, Bacteria: NO GROWTH

## 2010-10-23 ENCOUNTER — Telehealth: Payer: Self-pay | Admitting: Family Medicine

## 2010-10-23 NOTE — Telephone Encounter (Signed)
Spoke with pt

## 2010-10-23 NOTE — Telephone Encounter (Signed)
Message copied by Baldemar Friday on Wed Oct 23, 2010  5:48 PM ------      Message from: Gershon Crane A      Created: Sun Oct 20, 2010  3:51 PM       Negative culture

## 2010-10-23 NOTE — Telephone Encounter (Signed)
Message copied by Baldemar Friday on Wed Oct 23, 2010  5:45 PM ------      Message from: Gershon Crane A      Created: Sun Oct 20, 2010  3:52 PM       Negative culture

## 2011-02-06 ENCOUNTER — Other Ambulatory Visit (INDEPENDENT_AMBULATORY_CARE_PROVIDER_SITE_OTHER): Payer: 59

## 2011-02-06 DIAGNOSIS — Z0000 Encounter for general adult medical examination without abnormal findings: Secondary | ICD-10-CM

## 2011-02-06 LAB — POCT URINALYSIS DIPSTICK
Blood, UA: NEGATIVE
Glucose, UA: NEGATIVE
Ketones, UA: NEGATIVE
Leukocytes, UA: NEGATIVE
Nitrite, UA: NEGATIVE
Spec Grav, UA: >=1.03
Urobilinogen, UA: 0.2
pH, UA: 5.5

## 2011-02-06 LAB — BASIC METABOLIC PANEL
BUN: 15 mg/dL (ref 6–23)
CO2: 28 mEq/L (ref 19–32)
Calcium: 8.7 mg/dL (ref 8.4–10.5)
Chloride: 106 mEq/L (ref 96–112)
Creatinine, Ser: 0.9 mg/dL (ref 0.4–1.5)
GFR: 99.57 mL/min (ref >60.00)
Glucose, Bld: 86 mg/dL (ref 70–99)
Potassium: 3.6 mEq/L (ref 3.5–5.1)
Sodium: 141 mEq/L (ref 135–145)

## 2011-02-06 LAB — HEPATIC FUNCTION PANEL
ALT: 25 U/L (ref 0–53)
AST: 28 U/L (ref 0–37)
Albumin: 4.1 g/dL (ref 3.5–5.2)
Alkaline Phosphatase: 55 U/L (ref 39–117)
Bilirubin, Direct: 0.1 mg/dL (ref 0.0–0.3)
Total Bilirubin: 0.8 mg/dL (ref 0.3–1.2)
Total Protein: 6.3 g/dL (ref 6.0–8.3)

## 2011-02-06 LAB — LIPID PANEL
Cholesterol: 148 mg/dL (ref 0–200)
HDL: 68.6 mg/dL (ref >39.00)
LDL Cholesterol: 58 mg/dL (ref 0–99)
Total CHOL/HDL Ratio: 2
Triglycerides: 107 mg/dL (ref 0.0–149.0)
VLDL: 21.4 mg/dL (ref 0.0–40.0)

## 2011-02-06 LAB — PSA: PSA: 0.02 ng/mL — ABNORMAL LOW (ref 0.10–4.00)

## 2011-02-06 LAB — TSH: TSH: 0.54 u[IU]/mL (ref 0.35–5.50)

## 2011-02-07 LAB — CBC WITH DIFFERENTIAL/PLATELET
Basophils Absolute: 0 10*3/uL (ref 0.0–0.1)
Basophils Relative: 0.5 % (ref 0.0–3.0)
Eosinophils Absolute: 0.2 10*3/uL (ref 0.0–0.7)
Eosinophils Relative: 4.1 % (ref 0.0–5.0)
HCT: 38.2 % — ABNORMAL LOW (ref 39.0–52.0)
Hemoglobin: 12.3 g/dL — ABNORMAL LOW (ref 13.0–17.0)
Lymphocytes Relative: 62.9 % — ABNORMAL HIGH (ref 12.0–46.0)
Lymphs Abs: 3.8 10*3/uL (ref 0.7–4.0)
MCHC: 32.3 g/dL (ref 30.0–36.0)
MCV: 73.6 fl — ABNORMAL LOW (ref 78.0–100.0)
Monocytes Absolute: 0.4 10*3/uL (ref 0.1–1.0)
Monocytes Relative: 6 % (ref 3.0–12.0)
Neutro Abs: 1.6 10*3/uL (ref 1.4–7.7)
Neutrophils Relative %: 26.5 % — ABNORMAL LOW (ref 43.0–77.0)
Platelets: 259 10*3/uL (ref 150.0–400.0)
RBC: 5.2 Mil/uL (ref 4.22–5.81)
RDW: 17.3 % — ABNORMAL HIGH (ref 11.5–14.6)
WBC: 6 10*3/uL (ref 4.5–10.5)

## 2011-02-12 ENCOUNTER — Ambulatory Visit (INDEPENDENT_AMBULATORY_CARE_PROVIDER_SITE_OTHER): Payer: 59 | Admitting: Family Medicine

## 2011-02-12 ENCOUNTER — Encounter: Payer: Self-pay | Admitting: Family Medicine

## 2011-02-12 VITALS — BP 114/68 | HR 90 | Temp 98.1°F | Ht 65.5 in | Wt 147.0 lb

## 2011-02-12 DIAGNOSIS — E291 Testicular hypofunction: Secondary | ICD-10-CM

## 2011-02-12 DIAGNOSIS — Z0000 Encounter for general adult medical examination without abnormal findings: Secondary | ICD-10-CM

## 2011-02-12 LAB — TESTOSTERONE: Testosterone: 637.12 ng/dL (ref 350.00–890.00)

## 2011-02-12 MED ORDER — OXYCODONE-ACETAMINOPHEN 10-325 MG PO TABS: 1.0000 | ORAL_TABLET | Freq: Four times a day (QID) | ORAL | Status: DC | PRN

## 2011-02-12 NOTE — Progress Notes (Signed)
Quick Note:  Spoke with pt ______ 

## 2011-02-12 NOTE — Progress Notes (Signed)
  Subjective:    Patient ID: James Bradley, male    DOB: [birth year], 41 y.o.   MRN: [id number]  HPI 41 yr old male for a cpx. He feels well except he feels tired a lot and his erections are difficult. He used Androgel several years ago but this was stopped when he was diagnosed with prostate cancer. He would like to get back on this if possible. His recent labs showed a negligible PSA level.    Review of Systems  Constitutional: Negative.   HENT: Negative.   Eyes: Negative.   Respiratory: Negative.   Cardiovascular: Negative.   Gastrointestinal: Negative.   Genitourinary: Negative.   Musculoskeletal: Negative.   Skin: Negative.   Neurological: Negative.   Hematological: Negative.   Psychiatric/Behavioral: Negative.        Objective:   Physical Exam  Constitutional: He is oriented to person, place, and time. He appears well-developed and well-nourished. No distress.  HENT:  Head: Normocephalic and atraumatic.  Right Ear: External ear normal.  Left Ear: External ear normal.  Nose: Nose normal.  Mouth/Throat: Oropharynx is clear and moist. No oropharyngeal exudate.  Eyes: Conjunctivae and EOM are normal. Pupils are equal, round, and reactive to light. Right eye exhibits no discharge. Left eye exhibits no discharge. No scleral icterus.  Neck: Neck supple. No JVD present. No tracheal deviation present. No thyromegaly present.  Cardiovascular: Normal rate, regular rhythm, normal heart sounds and intact distal pulses.  Exam reveals no gallop and no friction rub.   No murmur heard. Pulmonary/Chest: Effort normal and breath sounds normal. No respiratory distress. He has no wheezes. He has no rales. He exhibits no tenderness.  Abdominal: Soft. Bowel sounds are normal. He exhibits no distension and no mass. There is no tenderness. There is no rebound and no guarding.  Genitourinary: Rectum normal and penis normal. Guaiac negative stool. No penile tenderness.       Prostate is absent     Musculoskeletal: Normal range of motion. He exhibits no edema and no tenderness.  Lymphadenopathy:    He has no cervical adenopathy.  Neurological: He is alert and oriented to person, place, and time. He has normal reflexes. No cranial nerve deficit. He exhibits normal muscle tone. Coordination normal.  Skin: Skin is warm and dry. No rash noted. He is not diaphoretic. No erythema. No pallor.  Psychiatric: He has a normal mood and affect. His behavior is normal. Judgment and thought content normal.          Assessment & Plan:  Well exam. Get a testosterone level today.

## 2011-02-14 MED ORDER — TADALAFIL 20 MG PO TABS: 20.0000 mg | ORAL_TABLET | Freq: Every day | ORAL | Status: DC | PRN

## 2011-02-14 NOTE — Progress Notes (Signed)
Quick Note:  Spoke with pt and also sent script e-scribe ______ 

## 2011-02-14 NOTE — Progress Notes (Signed)
Addended by: Aniceto Boss A on: 02/14/2011 04:39 PM   Modules accepted: Orders

## 2011-04-01 ENCOUNTER — Other Ambulatory Visit: Payer: Self-pay

## 2011-04-01 NOTE — Telephone Encounter (Signed)
Rx request for alprazolam 0.5 mg. Take 1 tablet tid.  Rx last filled 10/03/10 #90 x 5 rf.  Rx request for zolpidem tartrate 10 mg.  Rx last filled 10/03/10 #30 x 5 rf.  Pt last seen 02/12/11.  Pls advise.

## 2011-04-03 ENCOUNTER — Telehealth: Payer: Self-pay | Admitting: Family Medicine

## 2011-04-03 MED ORDER — ZOLPIDEM TARTRATE 10 MG PO TABS: 10.0000 mg | ORAL_TABLET | Freq: Every evening | ORAL | Status: DC | PRN

## 2011-04-03 MED ORDER — ALPRAZOLAM 0.5 MG PO TABS: 0.5000 mg | ORAL_TABLET | Freq: Three times a day (TID) | ORAL | Status: DC | PRN

## 2011-04-03 NOTE — Telephone Encounter (Signed)
Call in Xanax #90 with 5 rf, also Zolpidem #30 with 5 rf 

## 2011-04-03 NOTE — Telephone Encounter (Signed)
I already answered this  

## 2011-04-03 NOTE — Telephone Encounter (Signed)
Pt called to check status

## 2011-04-03 NOTE — Telephone Encounter (Signed)
Scripts called in

## 2011-04-03 NOTE — Telephone Encounter (Signed)
Refill request for Alprazolam 0.5 mg take 1 po tid prn and Zolpidem Tartrate 10 mg take 1 po qhs and pt last here 1 /20/13.

## 2011-05-15 ENCOUNTER — Ambulatory Visit: Payer: 59 | Admitting: Family Medicine

## 2011-05-16 ENCOUNTER — Ambulatory Visit (INDEPENDENT_AMBULATORY_CARE_PROVIDER_SITE_OTHER): Payer: 59 | Admitting: Family Medicine

## 2011-05-16 ENCOUNTER — Encounter: Payer: Self-pay | Admitting: Family Medicine

## 2011-05-16 VITALS — BP 110/80 | HR 85 | Temp 98.3°F | Wt 146.0 lb

## 2011-05-16 DIAGNOSIS — D649 Anemia, unspecified: Secondary | ICD-10-CM

## 2011-05-16 DIAGNOSIS — R5383 Other fatigue: Secondary | ICD-10-CM

## 2011-05-16 DIAGNOSIS — R5381 Other malaise: Secondary | ICD-10-CM

## 2011-05-16 LAB — BASIC METABOLIC PANEL
BUN: 16 mg/dL (ref 6–23)
CO2: 29 mEq/L (ref 19–32)
Calcium: 9.2 mg/dL (ref 8.4–10.5)
Chloride: 106 mEq/L (ref 96–112)
Creatinine, Ser: 0.8 mg/dL (ref 0.4–1.5)
GFR: 110.86 mL/min (ref >60.00)
Glucose, Bld: 81 mg/dL (ref 70–99)
Potassium: 4.3 mEq/L (ref 3.5–5.1)
Sodium: 142 mEq/L (ref 135–145)

## 2011-05-16 LAB — CBC WITH DIFFERENTIAL/PLATELET
Basophils Absolute: 0.1 10*3/uL (ref 0.0–0.1)
Basophils Relative: 0.8 % (ref 0.0–3.0)
Eosinophils Absolute: 0.2 10*3/uL (ref 0.0–0.7)
Eosinophils Relative: 2.7 % (ref 0.0–5.0)
HCT: 40.2 % (ref 39.0–52.0)
Hemoglobin: 12.9 g/dL — ABNORMAL LOW (ref 13.0–17.0)
Lymphocytes Relative: 50.8 % — ABNORMAL HIGH (ref 12.0–46.0)
Lymphs Abs: 3.3 10*3/uL (ref 0.7–4.0)
MCHC: 32.2 g/dL (ref 30.0–36.0)
MCV: 72.8 fl — ABNORMAL LOW (ref 78.0–100.0)
Monocytes Absolute: 0.5 10*3/uL (ref 0.1–1.0)
Monocytes Relative: 7.2 % (ref 3.0–12.0)
Neutro Abs: 2.5 10*3/uL (ref 1.4–7.7)
Neutrophils Relative %: 38.5 % — ABNORMAL LOW (ref 43.0–77.0)
Platelets: 241 10*3/uL (ref 150.0–400.0)
RBC: 5.51 Mil/uL (ref 4.22–5.81)
RDW: 16.9 % — ABNORMAL HIGH (ref 11.5–14.6)
WBC: 6.5 10*3/uL (ref 4.5–10.5)

## 2011-05-16 LAB — FOLATE: Folate: 9.2 ng/mL (ref >5.9)

## 2011-05-16 LAB — IRON: Iron: 54 ug/dL (ref 42–165)

## 2011-05-16 LAB — VITAMIN B12: Vitamin B-12: 452 pg/mL (ref 211–911)

## 2011-05-16 LAB — TSH: TSH: 0.52 u[IU]/mL (ref 0.35–5.50)

## 2011-05-16 LAB — FERRITIN: Ferritin: 120 ng/mL (ref 22.0–322.0)

## 2011-05-16 MED ORDER — OXYCODONE-ACETAMINOPHEN 10-325 MG PO TABS: 1.0000 | ORAL_TABLET | Freq: Four times a day (QID) | ORAL | Status: DC | PRN

## 2011-05-16 NOTE — Progress Notes (Signed)
  Subjective:    Patient ID: James Bradley, male    DOB: [birth year], 41 y.o.   MRN: [id number]  HPI Here for one week of fatigue and getting mildly SOB when mowing his lawn several days ago. No symptoms to suggest an infection anywhere. No fevers. No chest pains. He did show some slight anemia on labs done this past January.    Review of Systems  Constitutional: Positive for fatigue.  Respiratory: Positive for shortness of breath. Negative for cough, chest tightness and wheezing.   Cardiovascular: Negative.   Gastrointestinal: Negative.   Genitourinary: Negative.   Neurological: Negative.        Objective:   Physical Exam  Constitutional: He is oriented to person, place, and time. He appears well-developed and well-nourished.  Neck: No thyromegaly present.  Cardiovascular: Normal rate, regular rhythm, normal heart sounds and intact distal pulses.   Pulmonary/Chest: Effort normal and breath sounds normal.  Lymphadenopathy:    He has no cervical adenopathy.  Neurological: He is alert and oriented to person, place, and time.          Assessment & Plan:  Fatigue of uncertain etiology. We will get some labs today to include TSH and a BMET. Check for anemia. Suggested he take a multivitamin daily.

## 2011-05-23 ENCOUNTER — Encounter: Payer: Self-pay | Admitting: Family Medicine

## 2011-05-23 NOTE — Progress Notes (Signed)
Quick Note:  Spoke with pt and put a copy of results in mail. ______ 

## 2011-06-20 ENCOUNTER — Telehealth: Payer: Self-pay | Admitting: Family Medicine

## 2011-06-20 NOTE — Telephone Encounter (Signed)
Pt called and said that he has 3 refills remaining or both the ALPRAZolam (XANAX) 0.5 MG tablet and zolpidem (AMBIEN) 10 MG tablet but [facility] on [address] told pt that his script need to be authorized by his pcp in order to get refills. Pls call. Pt aware that pcp is out of office.

## 2011-06-20 NOTE — Telephone Encounter (Signed)
I called pharmacy and left a message, pt should have 5 refills on each and I did speak with pt also.

## 2011-07-01 ENCOUNTER — Other Ambulatory Visit: Payer: Self-pay | Admitting: Family Medicine

## 2011-07-01 NOTE — Telephone Encounter (Signed)
Pt needs new rx oxycodone °

## 2011-07-02 MED ORDER — OXYCODONE-ACETAMINOPHEN 10-325 MG PO TABS: 1.0000 | ORAL_TABLET | Freq: Four times a day (QID) | ORAL | Status: DC | PRN

## 2011-07-02 NOTE — Telephone Encounter (Signed)
Script is ready and left voice message.

## 2011-07-02 NOTE — Telephone Encounter (Signed)
done

## 2011-08-14 ENCOUNTER — Telehealth: Payer: Self-pay | Admitting: Family Medicine

## 2011-08-14 HISTORY — PX: COLONOSCOPY: SHX174

## 2011-08-14 NOTE — Telephone Encounter (Signed)
Patient called stating that he need a refill of his percocet. Please assist.

## 2011-08-15 MED ORDER — OXYCODONE-ACETAMINOPHEN 10-325 MG PO TABS: 1.0000 | ORAL_TABLET | Freq: Four times a day (QID) | ORAL | Status: DC | PRN

## 2011-08-15 NOTE — Telephone Encounter (Signed)
Done

## 2011-08-15 NOTE — Telephone Encounter (Signed)
Script is ready for pick up and I spoke with pt.  

## 2011-09-18 ENCOUNTER — Other Ambulatory Visit: Payer: Self-pay | Admitting: Family Medicine

## 2011-09-18 ENCOUNTER — Telehealth: Payer: Self-pay | Admitting: Family Medicine

## 2011-09-18 NOTE — Telephone Encounter (Signed)
Patient called stating that he need a refill of his oxycodone. Please assist.  °

## 2011-09-18 NOTE — Telephone Encounter (Signed)
Call in Ambien #30 with 5 rf, also Xanax #90 with 5 rf

## 2011-09-19 MED ORDER — OXYCODONE-ACETAMINOPHEN 10-325 MG PO TABS: 1.0000 | ORAL_TABLET | Freq: Four times a day (QID) | ORAL | Status: DC | PRN

## 2011-09-19 NOTE — Telephone Encounter (Signed)
done

## 2011-09-19 NOTE — Telephone Encounter (Signed)
Script is ready for pick up and I spoke with pt.  

## 2011-09-22 ENCOUNTER — Ambulatory Visit (INDEPENDENT_AMBULATORY_CARE_PROVIDER_SITE_OTHER): Payer: 59 | Admitting: Family Medicine

## 2011-09-22 ENCOUNTER — Encounter: Payer: Self-pay | Admitting: Family Medicine

## 2011-09-22 VITALS — BP 110/76 | HR 86 | Temp 97.7°F | Wt 140.0 lb

## 2011-09-22 DIAGNOSIS — R81 Glycosuria: Secondary | ICD-10-CM

## 2011-09-22 LAB — BASIC METABOLIC PANEL
BUN: 15 mg/dL (ref 6–23)
CO2: 28 mEq/L (ref 19–32)
Calcium: 9.4 mg/dL (ref 8.4–10.5)
Chloride: 103 mEq/L (ref 96–112)
Creatinine, Ser: 0.9 mg/dL (ref 0.4–1.5)
GFR: 98.01 mL/min (ref >60.00)
Glucose, Bld: 74 mg/dL (ref 70–99)
Potassium: 4.3 mEq/L (ref 3.5–5.1)
Sodium: 139 mEq/L (ref 135–145)

## 2011-09-22 LAB — HEMOGLOBIN A1C: Hgb A1c MFr Bld: 5.7 % (ref 4.6–6.5)

## 2011-09-22 LAB — TSH: TSH: 0.78 u[IU]/mL (ref 0.35–5.50)

## 2011-09-22 NOTE — Progress Notes (Signed)
  Subjective:    Patient ID: James Bradley, male    DOB: [birth year], 41 y.o.   MRN: [id number]  HPI Here to follow up on some glucosuria found at the Urology office last week. He has felt fine. No hx of diabetes. His only family hx of diabetes is a grandfather. He eats a fairly healthy diet but he has been taking protein powder shakes and creatine lately to try to gain weight.    Review of Systems  Constitutional: Negative.   Respiratory: Negative.   Cardiovascular: Negative.        Objective:   Physical Exam  Constitutional: He appears well-developed and well-nourished.  Neck: No thyromegaly present.  Cardiovascular: Normal rate, regular rhythm, normal heart sounds and intact distal pulses.   Pulmonary/Chest: Effort normal and breath sounds normal.  Lymphadenopathy:    He has no cervical adenopathy.          Assessment & Plan:  Check labs to day including an A1c. I told him he could take in more proteins to gain weight but that he needs to limit his carbohydrate intake.

## 2011-09-26 NOTE — Progress Notes (Signed)
Quick Note:  Called and spoke with pt and pt is aware. ______ 

## 2011-10-14 ENCOUNTER — Other Ambulatory Visit: Payer: Self-pay | Admitting: Family Medicine

## 2011-10-14 NOTE — Telephone Encounter (Signed)
Pt needs new rx percocet °

## 2011-10-17 MED ORDER — OXYCODONE-ACETAMINOPHEN 10-325 MG PO TABS: 1.0000 | ORAL_TABLET | Freq: Four times a day (QID) | ORAL | Status: DC | PRN

## 2011-10-17 NOTE — Telephone Encounter (Signed)
done

## 2011-10-17 NOTE — Telephone Encounter (Signed)
Script is ready for pick up and I spoke with pt.  

## 2011-11-06 ENCOUNTER — Other Ambulatory Visit: Payer: Self-pay | Admitting: Family Medicine

## 2011-11-06 NOTE — Telephone Encounter (Signed)
Last seen 09-22-11 Last written 10/17/11 #120  0Rf Please advie

## 2011-11-06 NOTE — Telephone Encounter (Signed)
Pt needs new rx oxycodone °

## 2011-11-07 NOTE — Telephone Encounter (Signed)
Spoke with pt - informed too early for Rf

## 2011-11-07 NOTE — Telephone Encounter (Signed)
No refills, too soon. Not until 11-17-11

## 2011-11-18 ENCOUNTER — Other Ambulatory Visit: Payer: Self-pay | Admitting: Family Medicine

## 2011-11-18 MED ORDER — OXYCODONE-ACETAMINOPHEN 10-325 MG PO TABS: 1.0000 | ORAL_TABLET | Freq: Four times a day (QID) | ORAL | Status: DC | PRN

## 2011-11-18 NOTE — Telephone Encounter (Signed)
Pt needs new rx oxycodone. Pt will pick up tomorrow sylvia

## 2011-11-18 NOTE — Telephone Encounter (Signed)
done

## 2011-11-18 NOTE — Telephone Encounter (Signed)
Script is ready for pick up and I spoke with pt.  

## 2011-12-03 ENCOUNTER — Encounter: Payer: Self-pay | Admitting: Family Medicine

## 2011-12-04 ENCOUNTER — Telehealth: Payer: Self-pay | Admitting: Family Medicine

## 2011-12-09 NOTE — Telephone Encounter (Signed)
Opened in Error.

## 2011-12-15 ENCOUNTER — Telehealth: Payer: Self-pay | Admitting: Family Medicine

## 2011-12-15 NOTE — Telephone Encounter (Signed)
Pt called req script of oxyCODONE-acetaminophen (PERCOCET) 10-325 MG per tablet

## 2011-12-16 MED ORDER — OXYCODONE-ACETAMINOPHEN 10-325 MG PO TABS: 1.0000 | ORAL_TABLET | Freq: Four times a day (QID) | ORAL | Status: DC | PRN

## 2011-12-16 NOTE — Telephone Encounter (Signed)
done

## 2011-12-16 NOTE — Telephone Encounter (Signed)
Script is ready for pick up, tried to call pt and line was busy.

## 2012-01-15 ENCOUNTER — Other Ambulatory Visit: Payer: Self-pay | Admitting: Family Medicine

## 2012-01-15 NOTE — Telephone Encounter (Signed)
Pt needs new rx percocet. Pt is aware MD out of office this week

## 2012-01-19 MED ORDER — OXYCODONE-ACETAMINOPHEN 10-325 MG PO TABS: 1.0000 | ORAL_TABLET | Freq: Four times a day (QID) | ORAL | Status: DC | PRN

## 2012-01-19 NOTE — Telephone Encounter (Signed)
done

## 2012-01-19 NOTE — Telephone Encounter (Signed)
Script is ready for pick up and spoke with pt. 

## 2012-02-11 ENCOUNTER — Other Ambulatory Visit (INDEPENDENT_AMBULATORY_CARE_PROVIDER_SITE_OTHER): Payer: 59

## 2012-02-11 DIAGNOSIS — Z0000 Encounter for general adult medical examination without abnormal findings: Secondary | ICD-10-CM

## 2012-02-11 LAB — POCT URINALYSIS DIPSTICK
Bilirubin, UA: NEGATIVE
Blood, UA: NEGATIVE
Glucose, UA: NEGATIVE
Leukocytes, UA: NEGATIVE
Nitrite, UA: NEGATIVE
Protein, UA: NEGATIVE
Spec Grav, UA: 1.025
Urobilinogen, UA: 0.2
pH, UA: 5.5

## 2012-02-11 LAB — TSH: TSH: 0.76 u[IU]/mL (ref 0.35–5.50)

## 2012-02-11 LAB — HEPATIC FUNCTION PANEL
ALT: 29 U/L (ref 0–53)
AST: 32 U/L (ref 0–37)
Albumin: 4.5 g/dL (ref 3.5–5.2)
Alkaline Phosphatase: 54 U/L (ref 39–117)
Bilirubin, Direct: 0.1 mg/dL (ref 0.0–0.3)
Total Bilirubin: 1 mg/dL (ref 0.3–1.2)
Total Protein: 7 g/dL (ref 6.0–8.3)

## 2012-02-11 LAB — BASIC METABOLIC PANEL
BUN: 26 mg/dL — ABNORMAL HIGH (ref 6–23)
CO2: 29 mEq/L (ref 19–32)
Calcium: 9.5 mg/dL (ref 8.4–10.5)
Chloride: 102 mEq/L (ref 96–112)
Creatinine, Ser: 0.9 mg/dL (ref 0.4–1.5)
GFR: 104.5 mL/min (ref >60.00)
Glucose, Bld: 81 mg/dL (ref 70–99)
Potassium: 4.8 mEq/L (ref 3.5–5.1)
Sodium: 139 mEq/L (ref 135–145)

## 2012-02-11 LAB — CBC WITH DIFFERENTIAL/PLATELET
Basophils Absolute: 0 10*3/uL (ref 0.0–0.1)
Basophils Relative: 0 % (ref 0.0–3.0)
Eosinophils Absolute: 0.2 10*3/uL (ref 0.0–0.7)
Eosinophils Relative: 4.2 % (ref 0.0–5.0)
HCT: 41.6 % (ref 39.0–52.0)
Hemoglobin: 13.3 g/dL (ref 13.0–17.0)
Lymphocytes Relative: 73.4 % — ABNORMAL HIGH (ref 12.0–46.0)
Lymphs Abs: 4.1 10*3/uL — ABNORMAL HIGH (ref 0.7–4.0)
MCHC: 31.9 g/dL (ref 30.0–36.0)
MCV: 72 fl — ABNORMAL LOW (ref 78.0–100.0)
Monocytes Absolute: 0.2 10*3/uL (ref 0.1–1.0)
Monocytes Relative: 4.3 % (ref 3.0–12.0)
Neutro Abs: 1 10*3/uL — ABNORMAL LOW (ref 1.4–7.7)
Neutrophils Relative %: 18.1 % — ABNORMAL LOW (ref 43.0–77.0)
Platelets: 269 10*3/uL (ref 150.0–400.0)
RBC: 5.77 Mil/uL (ref 4.22–5.81)
RDW: 17.3 % — ABNORMAL HIGH (ref 11.5–14.6)
WBC: 5.6 10*3/uL (ref 4.5–10.5)

## 2012-02-11 LAB — LIPID PANEL
Cholesterol: 189 mg/dL (ref 0–200)
HDL: 82.7 mg/dL (ref >39.00)
LDL Cholesterol: 87 mg/dL (ref 0–99)
Total CHOL/HDL Ratio: 2
Triglycerides: 95 mg/dL (ref 0.0–149.0)
VLDL: 19 mg/dL (ref 0.0–40.0)

## 2012-02-11 LAB — PSA: PSA: 0.03 ng/mL — ABNORMAL LOW (ref 0.10–4.00)

## 2012-02-16 NOTE — Progress Notes (Signed)
Quick Note:  Pt has CPE on 02/18/12 will go over then. ______

## 2012-02-18 ENCOUNTER — Ambulatory Visit (INDEPENDENT_AMBULATORY_CARE_PROVIDER_SITE_OTHER): Payer: 59 | Admitting: Family Medicine

## 2012-02-18 ENCOUNTER — Encounter: Payer: Self-pay | Admitting: Family Medicine

## 2012-02-18 VITALS — BP 114/78 | HR 109 | Temp 98.2°F | Ht 65.0 in | Wt 135.0 lb

## 2012-02-18 DIAGNOSIS — Z0000 Encounter for general adult medical examination without abnormal findings: Secondary | ICD-10-CM

## 2012-02-18 MED ORDER — TADALAFIL 5 MG PO TABS: 5.0000 mg | ORAL_TABLET | Freq: Every day | ORAL | Status: DC

## 2012-02-18 MED ORDER — OXYCODONE-ACETAMINOPHEN 10-325 MG PO TABS: 1.0000 | ORAL_TABLET | Freq: Four times a day (QID) | ORAL | Status: DC | PRN

## 2012-02-18 NOTE — Progress Notes (Signed)
  Subjective:    Patient ID: James Bradley, male    DOB: January 17, 1970, 42 y.o.   MRN: [id number]  HPI 42 yr old male for a cpx. He feels well in general but he is frustrated by his inability to gain weight. He works out and tries to take in some high calorie shakes at times, but he admits to a high stress job and he often skips meals.    Review of Systems  Constitutional: Negative.   HENT: Negative.   Eyes: Negative.   Respiratory: Negative.   Cardiovascular: Negative.   Gastrointestinal: Negative.   Genitourinary: Negative.   Musculoskeletal: Negative.   Skin: Negative.   Neurological: Negative.   Hematological: Negative.   Psychiatric/Behavioral: Negative.        Objective:   Physical Exam  Constitutional: He is oriented to person, place, and time. He appears well-developed and well-nourished. No distress.  HENT:  Head: Normocephalic and atraumatic.  Right Ear: External ear normal.  Left Ear: External ear normal.  Nose: Nose normal.  Mouth/Throat: Oropharynx is clear and moist. No oropharyngeal exudate.  Eyes: Conjunctivae normal and EOM are normal. Pupils are equal, round, and reactive to light. Right eye exhibits no discharge. Left eye exhibits no discharge. No scleral icterus.  Neck: Neck supple. No JVD present. No tracheal deviation present. No thyromegaly present.  Cardiovascular: Normal rate, regular rhythm, normal heart sounds and intact distal pulses.  Exam reveals no gallop and no friction rub.   No murmur heard. Pulmonary/Chest: Effort normal and breath sounds normal. No respiratory distress. He has no wheezes. He has no rales. He exhibits no tenderness.  Abdominal: Soft. Bowel sounds are normal. He exhibits no distension and no mass. There is no tenderness. There is no rebound and no guarding.  Musculoskeletal: Normal range of motion. He exhibits no edema and no tenderness.  Lymphadenopathy:    He has no cervical adenopathy.  Neurological: He is alert and  oriented to person, place, and time. He has normal reflexes. No cranial nerve deficit. He exhibits normal muscle tone. Coordination normal.  Skin: Skin is warm and dry. No rash noted. He is not diaphoretic. No erythema. No pallor.  Psychiatric: He has a normal mood and affect. His behavior is normal. Judgment and thought content normal.          Assessment & Plan:  Well exam. He will see Dr. Annabell Howells next month

## 2012-03-13 ENCOUNTER — Other Ambulatory Visit: Payer: Self-pay | Admitting: Family Medicine

## 2012-03-15 ENCOUNTER — Other Ambulatory Visit: Payer: Self-pay | Admitting: Family Medicine

## 2012-03-15 ENCOUNTER — Telehealth: Payer: Self-pay | Admitting: Family Medicine

## 2012-03-15 NOTE — Telephone Encounter (Signed)
Pt needs refill of oxyCODONE-acetaminophen (PERCOCET) 10-325 MG per tablet  

## 2012-03-15 NOTE — Telephone Encounter (Signed)
Call in Xanax #90 with 5 rf and Zolpidem #30 with 5 rf

## 2012-03-16 ENCOUNTER — Other Ambulatory Visit: Payer: Self-pay | Admitting: Family Medicine

## 2012-03-16 MED ORDER — OXYCODONE-ACETAMINOPHEN 10-325 MG PO TABS: 1.0000 | ORAL_TABLET | Freq: Four times a day (QID) | ORAL | Status: DC | PRN

## 2012-03-16 NOTE — Telephone Encounter (Signed)
I called in scripts. 

## 2012-03-16 NOTE — Telephone Encounter (Signed)
done

## 2012-03-16 NOTE — Telephone Encounter (Signed)
Script is ready for pick up and left voice message for pt. 

## 2012-04-13 ENCOUNTER — Telehealth: Payer: Self-pay | Admitting: Family Medicine

## 2012-04-13 MED ORDER — OXYCODONE-ACETAMINOPHEN 10-325 MG PO TABS: 1.0000 | ORAL_TABLET | Freq: Four times a day (QID) | ORAL | Status: DC | PRN

## 2012-04-13 NOTE — Telephone Encounter (Signed)
Patient called stating that he need a refill of his oxycodone 10-325mg  1po q 6hrs prn for pain. Please assist.

## 2012-04-13 NOTE — Telephone Encounter (Signed)
Script is ready for pick up, tried to reach pt by phone and no answer. 

## 2012-04-13 NOTE — Telephone Encounter (Signed)
done

## 2012-05-10 ENCOUNTER — Telehealth: Payer: Self-pay | Admitting: Family Medicine

## 2012-05-10 NOTE — Telephone Encounter (Signed)
Patient called stating that he need a refill of his oxycodone 10-325 mg 1po q 6 hrs prn for pain. Please assist as patient's flex card will expire in 2days.

## 2012-05-11 MED ORDER — OXYCODONE-ACETAMINOPHEN 10-325 MG PO TABS: 1.0000 | ORAL_TABLET | Freq: Four times a day (QID) | ORAL | Status: DC | PRN

## 2012-05-11 NOTE — Telephone Encounter (Signed)
Script is ready for pick up and left voice message. 

## 2012-05-11 NOTE — Telephone Encounter (Signed)
done

## 2012-06-14 ENCOUNTER — Telehealth: Payer: Self-pay | Admitting: Family Medicine

## 2012-06-14 MED ORDER — OXYCODONE-ACETAMINOPHEN 10-325 MG PO TABS: 1.0000 | ORAL_TABLET | Freq: Four times a day (QID) | ORAL | Status: DC | PRN

## 2012-06-14 NOTE — Telephone Encounter (Signed)
done

## 2012-06-14 NOTE — Telephone Encounter (Signed)
Pt needs new rx percocet °

## 2012-06-14 NOTE — Telephone Encounter (Signed)
Script is ready for pick up and left message. 

## 2012-07-09 ENCOUNTER — Telehealth: Payer: Self-pay | Admitting: Family Medicine

## 2012-07-09 MED ORDER — OXYCODONE-ACETAMINOPHEN 10-325 MG PO TABS: 1.0000 | ORAL_TABLET | Freq: Four times a day (QID) | ORAL | Status: DC | PRN

## 2012-07-09 NOTE — Telephone Encounter (Signed)
Script is ready for pick up and tried to reach pt, no answer. 

## 2012-07-09 NOTE — Telephone Encounter (Signed)
done

## 2012-07-09 NOTE — Telephone Encounter (Signed)
Pt needs refill of oxyCODONE-acetaminophen (PERCOCET) 10-325 MG per tablet. 30 days   

## 2012-08-06 ENCOUNTER — Telehealth: Payer: Self-pay | Admitting: Family Medicine

## 2012-08-06 MED ORDER — OXYCODONE-ACETAMINOPHEN 10-325 MG PO TABS: 1.0000 | ORAL_TABLET | Freq: Four times a day (QID) | ORAL | Status: DC | PRN

## 2012-08-06 NOTE — Telephone Encounter (Signed)
Script is ready for pick up and I spoke with pt.  

## 2012-08-06 NOTE — Telephone Encounter (Signed)
See below note.

## 2012-08-06 NOTE — Telephone Encounter (Signed)
Pt needs refill of oxyCODONE-acetaminophen (PERCOCET) 10-325 MG per tablet  

## 2012-08-06 NOTE — Telephone Encounter (Signed)
done

## 2012-08-25 ENCOUNTER — Encounter: Payer: Self-pay | Admitting: Family Medicine

## 2012-08-25 ENCOUNTER — Ambulatory Visit (INDEPENDENT_AMBULATORY_CARE_PROVIDER_SITE_OTHER): Payer: 59 | Admitting: Family Medicine

## 2012-08-25 VITALS — BP 122/70 | HR 89 | Temp 98.2°F | Wt 134.0 lb

## 2012-08-25 DIAGNOSIS — F5104 Psychophysiologic insomnia: Secondary | ICD-10-CM

## 2012-08-25 DIAGNOSIS — R1013 Epigastric pain: Secondary | ICD-10-CM

## 2012-08-25 DIAGNOSIS — G4700 Insomnia, unspecified: Secondary | ICD-10-CM

## 2012-08-25 DIAGNOSIS — F438 Other reactions to severe stress: Secondary | ICD-10-CM

## 2012-08-25 DIAGNOSIS — F4329 Adjustment disorder with other symptoms: Secondary | ICD-10-CM

## 2012-08-25 DIAGNOSIS — F4389 Other reactions to severe stress: Secondary | ICD-10-CM

## 2012-08-25 MED ORDER — PANTOPRAZOLE SODIUM 40 MG PO TBEC: 40.0000 mg | DELAYED_RELEASE_TABLET | Freq: Every day | ORAL | Status: DC

## 2012-08-25 MED ORDER — ESZOPICLONE 2 MG PO TABS: 2.0000 mg | ORAL_TABLET | Freq: Every day | ORAL | Status: DC

## 2012-08-25 NOTE — Patient Instructions (Addendum)
Insomnia Insomnia is frequent trouble falling and/or staying asleep. Insomnia can be a long term problem or a short term problem. Both are common. Insomnia can be a short term problem when the wakefulness is related to a certain stress or worry. Long term insomnia is often related to ongoing stress during waking hours and/or poor sleeping habits. Overtime, sleep deprivation itself can make the problem worse. Every little thing feels more severe because you are overtired and your ability to cope is decreased. CAUSES   Stress, anxiety, and depression.  Poor sleeping habits.  Distractions such as TV in the bedroom.  Naps close to bedtime.  Engaging in emotionally charged conversations before bed.  Technical reading before sleep.  Alcohol and other sedatives. They may make the problem worse. They can hurt normal sleep patterns and normal dream activity.  Stimulants such as caffeine for several hours prior to bedtime.  Pain syndromes and shortness of breath can cause insomnia.  Exercise late at night.  Changing time zones may cause sleeping problems (jet lag). It is sometimes helpful to have someone observe your sleeping patterns. They should look for periods of not breathing during the night (sleep apnea). They should also look to see how long those periods last. If you live alone or observers are uncertain, you can also be observed at a sleep clinic where your sleep patterns will be professionally monitored. Sleep apnea requires a checkup and treatment. Give your caregivers your medical history. Give your caregivers observations your family has made about your sleep.  SYMPTOMS   Not feeling rested in the morning.  Anxiety and restlessness at bedtime.  Difficulty falling and staying asleep. TREATMENT   Your caregiver may prescribe treatment for an underlying medical disorders. Your caregiver can give advice or help if you are using alcohol or other drugs for self-medication. Treatment  of underlying problems will usually eliminate insomnia problems.  Medications can be prescribed for short time use. They are generally not recommended for lengthy use.  Over-the-counter sleep medicines are not recommended for lengthy use. They can be habit forming.  You can promote easier sleeping by making lifestyle changes such as:  Using relaxation techniques that help with breathing and reduce muscle tension.  Exercising earlier in the day.  Changing your diet and the time of your last meal. No night time snacks.  Establish a regular time to go to bed.  Counseling can help with stressful problems and worry.  Soothing music and white noise may be helpful if there are background noises you cannot remove.  Stop tedious detailed work at least one hour before bedtime. HOME CARE INSTRUCTIONS   Keep a diary. Inform your caregiver about your progress. This includes any medication side effects. See your caregiver regularly. Take note of:  Times when you are asleep.  Times when you are awake during the night.  The quality of your sleep.  How you feel the next day. This information will help your caregiver care for you.  Get out of bed if you are still awake after 15 minutes. Read or do some quiet activity. Keep the lights down. Wait until you feel sleepy and go back to bed.  Keep regular sleeping and waking hours. Avoid naps.  Exercise regularly.  Avoid distractions at bedtime. Distractions include watching television or engaging in any intense or detailed activity like attempting to balance the household checkbook.  Develop a bedtime ritual. Keep a familiar routine of bathing, brushing your teeth, climbing into bed at the same   time each night, listening to soothing music. Routines increase the success of falling to sleep faster.  Use relaxation techniques. This can be using breathing and muscle tension release routines. It can also include visualizing peaceful scenes. You can  also help control troubling or intruding thoughts by keeping your mind occupied with boring or repetitive thoughts like the old concept of counting sheep. You can make it more creative like imagining planting one beautiful flower after another in your backyard garden.  During your day, work to eliminate stress. When this is not possible use some of the previous suggestions to help reduce the anxiety that accompanies stressful situations. MAKE SURE YOU:   Understand these instructions.  Will watch your condition.  Will get help right away if you are not doing well or get worse. Document Released: 12/28/1999 Document Revised: 03/24/2011 Document Reviewed: 01/27/2007 [facility] Patient Information 2014 [place], [place].  Follow up with Dr Clent Ridges if symptoms not improved in 2 weeks.

## 2012-08-25 NOTE — Progress Notes (Signed)
Subjective:    Patient ID: James Bradley, male    DOB: [birth year], 42 y.o.   MRN: [id number]  HPI  Right sided abdominal pain off and on for 2 years. Sharp to burning pain.  No significant radiation. Severity is sometimes 8/10 Better with holding pressure.  Exacerbated by stress. Increased stress.   He thinks this is exacerbating.   No nausea or vomiting. He initially stated he was losing some weight the morning they had difficulty gaining weight. Only 1 pound of weight loss since physical last February  Prior colonoscopy normal.  EGD mild gastritis 3/13.   No melena.  Does smoke.  ETOH 1-2 beers per day.  No regular NSAIDS.    Other issues some chronic insomnia. Does sometimes drink one to 2 beers at night. No caffeine use. Mostly has difficulty falling asleep. Has previously used Ambien but that did not seem to work well. He has stress issues as above does not feel depressed generally.  Past Medical History  Diagnosis Date  . Prostatitis   . Insomnia   . Anxiety   . GERD (gastroesophageal reflux disease)   . Low back pain   . Herpes labialis   . Onychomycosis   . Cancer     prostate , sees Dr. Annabell Howells    Past Surgical History  Procedure Laterality Date  . Microdiskectomy  2009    at L5-S1, per Dr. Zenon Mayo   . Esi dr Ophelia Charter    . Prostatectomy  08-20-10    per Dr. Annabell Howells  . Colonoscopy  August 2013     per Dr. Jeani Hawking , clear, repeat in 10 yrs     reports that he has been smoking.  He has never used smokeless tobacco. He reports that he drinks about 1.0 ounces of alcohol per week. He reports that he does not use illicit drugs. family history includes Arthritis in an other family member; Diabetes in an other family member. No Known Allergies   Review of Systems  Constitutional: Negative for fever, chills, appetite change and unexpected weight change.  HENT: Negative for trouble swallowing.   Respiratory: Negative for cough and shortness of breath.    Cardiovascular: Negative for chest pain.  Gastrointestinal: Positive for abdominal pain. Negative for nausea, vomiting, diarrhea, constipation and blood in stool.  Genitourinary: Negative for dysuria.  Musculoskeletal: Positive for back pain.  Skin: Negative for rash.  Neurological: Negative for dizziness.       Objective:   Physical Exam  Constitutional: He appears well-developed and well-nourished. No distress.  HENT:  Mouth/Throat: Oropharynx is clear and moist.  Neck: Neck supple. No thyromegaly present.  Cardiovascular: Normal rate and regular rhythm.   Pulmonary/Chest: Effort normal and breath sounds normal. No respiratory distress. He has no wheezes. He has no rales.  Abdominal: Soft. Bowel sounds are normal. He exhibits no distension and no mass. There is no rebound and no guarding.  Minimally tender epigastric region. No guarding. No masses. No hepatomegaly or splenomegaly.  Musculoskeletal: He exhibits no edema.          Assessment & Plan:  #1 epigastric pain. Past history of gastritis. Relatively nonfocal exam. He does not have any red flags such as substantiated significant weight loss, hematemesis, melena. Avoid nonsteroidals. Reduce alcohol intake. Start Protonix 40 mg once daily. Followup with primary in 2 weeks if symptoms not improving #2 chronic insomnia. Sleep hygiene discussed. Avoid alcohol regularly prior to sleep. Has tried things like Benadryl but had hangover  drowsiness. Short-term only use of Lunesta 2 mg each bedtime when necessary

## 2012-08-31 ENCOUNTER — Telehealth: Payer: Self-pay | Admitting: Family Medicine

## 2012-08-31 NOTE — Telephone Encounter (Signed)
Pt saw dr Caryl Never last week for ab and back pain. Pt is still having issues. Would like an appt tomorrow. Only same day. pls advise

## 2012-09-01 ENCOUNTER — Ambulatory Visit (INDEPENDENT_AMBULATORY_CARE_PROVIDER_SITE_OTHER): Payer: 59 | Admitting: Family Medicine

## 2012-09-01 ENCOUNTER — Encounter: Payer: Self-pay | Admitting: Family Medicine

## 2012-09-01 VITALS — BP 102/74 | HR 76 | Temp 99.0°F | Wt 129.0 lb

## 2012-09-01 DIAGNOSIS — R059 Cough, unspecified: Secondary | ICD-10-CM

## 2012-09-01 DIAGNOSIS — K219 Gastro-esophageal reflux disease without esophagitis: Secondary | ICD-10-CM

## 2012-09-01 DIAGNOSIS — R05 Cough: Secondary | ICD-10-CM

## 2012-09-01 DIAGNOSIS — G4700 Insomnia, unspecified: Secondary | ICD-10-CM

## 2012-09-01 MED ORDER — OXYCODONE-ACETAMINOPHEN 10-325 MG PO TABS: 1.0000 | ORAL_TABLET | Freq: Four times a day (QID) | ORAL | Status: DC | PRN

## 2012-09-01 MED ORDER — ALPRAZOLAM 0.5 MG PO TABS: 1.0000 mg | ORAL_TABLET | Freq: Three times a day (TID) | ORAL | Status: DC | PRN

## 2012-09-01 MED ORDER — ZOLPIDEM TARTRATE 10 MG PO TABS: 10.0000 mg | ORAL_TABLET | Freq: Every evening | ORAL | Status: DC | PRN

## 2012-09-01 NOTE — Progress Notes (Signed)
  Subjective:    Patient ID: James Bradley, male    DOB: 1970-04-30, 42 y.o.   MRN: [id number]  HPI Here to follow up on epigastric pain. He has had epigastric burning pains for the past month that come and go. No nausea or vomiting. BMs are regular. He was here last week and was given Protonix to take once daily. This has been very successful. He feels better although there is still some upper abdominal pains. He wants to go back on Ambien instead of Lunesta.    Review of Systems  Constitutional: Negative.   Respiratory: Negative.   Cardiovascular: Negative.   Gastrointestinal: Positive for abdominal pain. Negative for nausea, vomiting, diarrhea, constipation, blood in stool, abdominal distention, anal bleeding and rectal pain.       Objective:   Physical Exam  Constitutional: He appears well-developed and well-nourished.  Cardiovascular: Normal rate, regular rhythm, normal heart sounds and intact distal pulses.   Pulmonary/Chest: Effort normal and breath sounds normal.  Abdominal: Soft. Bowel sounds are normal. He exhibits no distension and no mass. There is no tenderness. There is no rebound and no guarding.          Assessment & Plan:  Probable GERD. He is responding to the Protonix so I advised him to stay on this for the time being. Avoid spicy or greasy or acidic foods. Get back on Ambien.

## 2012-09-01 NOTE — Telephone Encounter (Signed)
Pt is being seen today 

## 2012-09-03 ENCOUNTER — Ambulatory Visit (INDEPENDENT_AMBULATORY_CARE_PROVIDER_SITE_OTHER)
Admission: RE | Admit: 2012-09-03 | Discharge: 2012-09-03 | Disposition: A | Discharge status: Home / Self Care | Payer: 59 | Source: Ambulatory Visit | Attending: Family Medicine | Admitting: Family Medicine

## 2012-09-03 DIAGNOSIS — R059 Cough, unspecified: Secondary | ICD-10-CM

## 2012-09-03 DIAGNOSIS — R05 Cough: Secondary | ICD-10-CM

## 2012-09-10 ENCOUNTER — Telehealth: Payer: Self-pay | Admitting: Family Medicine

## 2012-09-10 MED ORDER — CIPROFLOXACIN HCL 500 MG PO TABS: 500.0000 mg | ORAL_TABLET | Freq: Two times a day (BID) | ORAL | Status: DC

## 2012-09-10 NOTE — Telephone Encounter (Signed)
I sent script e-scribe and spoke with pt. 

## 2012-09-10 NOTE — Telephone Encounter (Signed)
Pt states that he was in to see Dr Clent Ridges last week for GI issues. He is requesting Dr. Clent Ridges write a rx for an antibiotic for these issues. [facility] on [address]

## 2012-09-10 NOTE — Telephone Encounter (Signed)
Call in Cipro 500 mg bid for 10 days  

## 2012-09-27 ENCOUNTER — Other Ambulatory Visit: Payer: Self-pay | Admitting: Family Medicine

## 2012-09-29 ENCOUNTER — Telehealth: Payer: Self-pay | Admitting: Family Medicine

## 2012-09-29 MED ORDER — OXYCODONE-ACETAMINOPHEN 10-325 MG PO TABS: 1.0000 | ORAL_TABLET | Freq: Four times a day (QID) | ORAL | Status: DC | PRN

## 2012-09-29 NOTE — Telephone Encounter (Signed)
Pt calling to request a refill of his oxyCODONE-acetaminophen (PERCOCET) 10-325 MG per tablet. Please assist.

## 2012-09-29 NOTE — Telephone Encounter (Signed)
done

## 2012-09-30 NOTE — Telephone Encounter (Signed)
Script is ready for pick up, tried to reach pt and no answer.  

## 2012-10-29 ENCOUNTER — Telehealth: Payer: Self-pay | Admitting: Family Medicine

## 2012-10-29 NOTE — Telephone Encounter (Signed)
Pt needs new rx oxycodone °

## 2012-11-02 MED ORDER — OXYCODONE-ACETAMINOPHEN 10-325 MG PO TABS: 1.0000 | ORAL_TABLET | Freq: Four times a day (QID) | ORAL | Status: DC | PRN

## 2012-11-02 NOTE — Telephone Encounter (Signed)
done

## 2012-11-02 NOTE — Telephone Encounter (Signed)
Script is ready for pick up and I left a voice message.  

## 2012-12-01 ENCOUNTER — Encounter: Payer: Self-pay | Admitting: Family Medicine

## 2012-12-01 ENCOUNTER — Telehealth: Payer: Self-pay | Admitting: Family Medicine

## 2012-12-01 MED ORDER — OXYCODONE-ACETAMINOPHEN 10-325 MG PO TABS: 1.0000 | ORAL_TABLET | Freq: Four times a day (QID) | ORAL | Status: DC | PRN

## 2012-12-01 NOTE — Telephone Encounter (Signed)
Script is ready for pick up, contract was printed and left a voice message for pt.

## 2012-12-01 NOTE — Telephone Encounter (Signed)
Pt needs new rx oxycodone °

## 2012-12-01 NOTE — Telephone Encounter (Signed)
Done but he needs to get drug testing

## 2013-02-08 ENCOUNTER — Other Ambulatory Visit (INDEPENDENT_AMBULATORY_CARE_PROVIDER_SITE_OTHER): Payer: 59

## 2013-02-08 ENCOUNTER — Telehealth: Payer: Self-pay | Admitting: Family Medicine

## 2013-02-08 DIAGNOSIS — Z0000 Encounter for general adult medical examination without abnormal findings: Secondary | ICD-10-CM

## 2013-02-08 LAB — BASIC METABOLIC PANEL
BUN: 8 mg/dL (ref 6–23)
CO2: 30 mEq/L (ref 19–32)
Calcium: 9 mg/dL (ref 8.4–10.5)
Chloride: 105 mEq/L (ref 96–112)
Creatinine, Ser: 0.8 mg/dL (ref 0.4–1.5)
GFR: 105.44 mL/min (ref >60.00)
Glucose, Bld: 85 mg/dL (ref 70–99)
Potassium: 4.3 mEq/L (ref 3.5–5.1)
Sodium: 141 mEq/L (ref 135–145)

## 2013-02-08 LAB — POCT URINALYSIS DIPSTICK
Bilirubin, UA: NEGATIVE
Blood, UA: NEGATIVE
Glucose, UA: NEGATIVE
Ketones, UA: NEGATIVE
Leukocytes, UA: NEGATIVE
Nitrite, UA: NEGATIVE
Protein, UA: NEGATIVE
Spec Grav, UA: 1.025
Urobilinogen, UA: 0.2
pH, UA: 5.5

## 2013-02-08 LAB — HEPATIC FUNCTION PANEL
ALT: 24 U/L (ref 0–53)
AST: 31 U/L (ref 0–37)
Albumin: 4 g/dL (ref 3.5–5.2)
Alkaline Phosphatase: 49 U/L (ref 39–117)
Bilirubin, Direct: 0.1 mg/dL (ref 0.0–0.3)
Total Bilirubin: 1 mg/dL (ref 0.3–1.2)
Total Protein: 6.4 g/dL (ref 6.0–8.3)

## 2013-02-08 LAB — CBC WITH DIFFERENTIAL/PLATELET
Basophils Absolute: 0 10*3/uL (ref 0.0–0.1)
Basophils Relative: 0.1 % (ref 0.0–3.0)
Eosinophils Absolute: 0.1 10*3/uL (ref 0.0–0.7)
Eosinophils Relative: 2.5 % (ref 0.0–5.0)
HCT: 38.1 % — ABNORMAL LOW (ref 39.0–52.0)
Hemoglobin: 12.4 g/dL — ABNORMAL LOW (ref 13.0–17.0)
Lymphocytes Relative: 76.3 % — ABNORMAL HIGH (ref 12.0–46.0)
Lymphs Abs: 4.5 10*3/uL — ABNORMAL HIGH (ref 0.7–4.0)
MCHC: 32.4 g/dL (ref 30.0–36.0)
MCV: 70.1 fl — ABNORMAL LOW (ref 78.0–100.0)
Monocytes Absolute: 0.3 10*3/uL (ref 0.1–1.0)
Monocytes Relative: 4.9 % (ref 3.0–12.0)
Neutro Abs: 1 10*3/uL — ABNORMAL LOW (ref 1.4–7.7)
Neutrophils Relative %: 16.2 % — ABNORMAL LOW (ref 43.0–77.0)
Platelets: 272 10*3/uL (ref 150.0–400.0)
RBC: 5.43 Mil/uL (ref 4.22–5.81)
RDW: 17.3 % — ABNORMAL HIGH (ref 11.5–14.6)
WBC: 5.9 10*3/uL (ref 4.5–10.5)

## 2013-02-08 LAB — LIPID PANEL
Cholesterol: 166 mg/dL (ref 0–200)
HDL: 86.5 mg/dL (ref >39.00)
LDL Cholesterol: 67 mg/dL (ref 0–99)
Total CHOL/HDL Ratio: 2
Triglycerides: 65 mg/dL (ref 0.0–149.0)
VLDL: 13 mg/dL (ref 0.0–40.0)

## 2013-02-08 LAB — PSA: PSA: 0.02 ng/mL — ABNORMAL LOW (ref 0.10–4.00)

## 2013-02-08 LAB — TSH: TSH: 0.92 u[IU]/mL (ref 0.35–5.50)

## 2013-02-08 MED ORDER — OXYCODONE-ACETAMINOPHEN 10-325 MG PO TABS: 1.0000 | ORAL_TABLET | Freq: Four times a day (QID) | ORAL | Status: DC | PRN

## 2013-02-08 NOTE — Telephone Encounter (Signed)
Pt needs rx percocet 325 mg sent [facility] on [address],

## 2013-02-08 NOTE — Telephone Encounter (Signed)
Done but he needs testing and a contract

## 2013-02-09 ENCOUNTER — Other Ambulatory Visit: Payer: 59

## 2013-02-09 NOTE — Telephone Encounter (Signed)
Script is ready for pick up and I left a voice message for pt. 

## 2013-02-18 ENCOUNTER — Encounter: Payer: Self-pay | Admitting: Family Medicine

## 2013-02-18 ENCOUNTER — Ambulatory Visit (INDEPENDENT_AMBULATORY_CARE_PROVIDER_SITE_OTHER): Payer: 59 | Admitting: Family Medicine

## 2013-02-18 VITALS — BP 112/80 | HR 94 | Temp 98.6°F | Ht 64.75 in | Wt 138.0 lb

## 2013-02-18 DIAGNOSIS — Z23 Encounter for immunization: Secondary | ICD-10-CM

## 2013-02-18 DIAGNOSIS — Z0000 Encounter for general adult medical examination without abnormal findings: Secondary | ICD-10-CM

## 2013-02-18 MED ORDER — FLUTICASONE PROPIONATE 50 MCG/ACT NA SUSP: 2.0000 | Freq: Every day | NASAL | Status: DC

## 2013-02-18 MED ORDER — ZOLPIDEM TARTRATE 10 MG PO TABS: 10.0000 mg | ORAL_TABLET | Freq: Every evening | ORAL | Status: DC | PRN

## 2013-02-18 MED ORDER — ALPRAZOLAM 0.5 MG PO TABS: 1.0000 mg | ORAL_TABLET | Freq: Three times a day (TID) | ORAL | Status: DC | PRN

## 2013-02-18 MED ORDER — TADALAFIL 5 MG PO TABS: 5.0000 mg | ORAL_TABLET | Freq: Every day | ORAL | Status: DC

## 2013-02-18 NOTE — Progress Notes (Signed)
Pre visit review using our clinic review tool, if applicable. No additional management support is needed unless otherwise documented below in the visit note. 

## 2013-02-18 NOTE — Progress Notes (Signed)
   Subjective:    Patient ID: James Bradley, male    DOB: 01-12-1971, 43 y.o.   MRN: [id number]  HPI 43 yr old male for a cpx. He feels fine except for chronic nasal and sinus congestion. No fever or HA. Using Claritin D or Mucinex.    Review of Systems  Constitutional: Negative.   HENT: Negative.   Eyes: Negative.   Respiratory: Negative.   Cardiovascular: Negative.   Gastrointestinal: Negative.   Genitourinary: Negative.   Musculoskeletal: Negative.   Skin: Negative.   Neurological: Negative.   Psychiatric/Behavioral: Negative.        Objective:   Physical Exam  Constitutional: He is oriented to person, place, and time. He appears well-developed and well-nourished. No distress.  HENT:  Head: Normocephalic and atraumatic.  Right Ear: External ear normal.  Left Ear: External ear normal.  Nose: Nose normal.  Mouth/Throat: Oropharynx is clear and moist. No oropharyngeal exudate.  Eyes: Conjunctivae and EOM are normal. Pupils are equal, round, and reactive to light. Right eye exhibits no discharge. Left eye exhibits no discharge. No scleral icterus.  Neck: Neck supple. No JVD present. No tracheal deviation present. No thyromegaly present.  Cardiovascular: Normal rate, regular rhythm, normal heart sounds and intact distal pulses.  Exam reveals no gallop and no friction rub.   No murmur heard. Pulmonary/Chest: Effort normal and breath sounds normal. No respiratory distress. He has no wheezes. He has no rales. He exhibits no tenderness.  Abdominal: Soft. Bowel sounds are normal. He exhibits no distension and no mass. There is no tenderness. There is no rebound and no guarding.  Genitourinary: Rectum normal and penis normal. Guaiac negative stool. No penile tenderness.  Prostate is absent   Musculoskeletal: Normal range of motion. He exhibits no edema and no tenderness.  Lymphadenopathy:    He has no cervical adenopathy.  Neurological: He is alert and oriented to person, place,  and time. He has normal reflexes. No cranial nerve deficit. He exhibits normal muscle tone. Coordination normal.  Skin: Skin is warm and dry. No rash noted. He is not diaphoretic. No erythema. No pallor.  Psychiatric: He has a normal mood and affect. His behavior is normal. Judgment and thought content normal.          Assessment & Plan:  Well exam. Try Flonase sprays daily. Suggested he take a daily multivitamin with iron.

## 2013-02-21 ENCOUNTER — Telehealth: Payer: Self-pay | Admitting: Family Medicine

## 2013-02-21 NOTE — Telephone Encounter (Signed)
Relevant patient education assigned to patient using Emmi. ° °

## 2013-03-11 ENCOUNTER — Telehealth: Payer: Self-pay | Admitting: Family Medicine

## 2013-03-11 MED ORDER — OXYCODONE-ACETAMINOPHEN 10-325 MG PO TABS: 1.0000 | ORAL_TABLET | Freq: Four times a day (QID) | ORAL | Status: DC | PRN

## 2013-03-11 NOTE — Telephone Encounter (Signed)
Done for 3 months

## 2013-03-11 NOTE — Addendum Note (Signed)
Addended by: Alysia Penna A on: 03/11/2013 01:49 PM   Modules accepted: Orders

## 2013-03-11 NOTE — Telephone Encounter (Signed)
Pt is needing new rx foroxyCODONE-acetaminophen (PERCOCET) 10-325 MG per tablet, please call when available for pick up.

## 2013-03-14 NOTE — Telephone Encounter (Signed)
Script is ready for pick up and I left a voice message for pt. 

## 2013-06-07 ENCOUNTER — Telehealth: Payer: Self-pay | Admitting: Family Medicine

## 2013-06-07 MED ORDER — OXYCODONE-ACETAMINOPHEN 10-325 MG PO TABS: 1.0000 | ORAL_TABLET | Freq: Four times a day (QID) | ORAL | Status: DC | PRN

## 2013-06-07 NOTE — Telephone Encounter (Signed)
Scripts are ready for pick up and I spoke with pt. 

## 2013-06-07 NOTE — Telephone Encounter (Signed)
Pt req rx on oxyCODONE-acetaminophen (PERCOCET) 10-325 MG per tablet ° °

## 2013-06-07 NOTE — Telephone Encounter (Signed)
done

## 2013-07-25 ENCOUNTER — Ambulatory Visit (INDEPENDENT_AMBULATORY_CARE_PROVIDER_SITE_OTHER): Payer: 59 | Admitting: Family Medicine

## 2013-07-25 ENCOUNTER — Encounter: Payer: Self-pay | Admitting: Family Medicine

## 2013-07-25 VITALS — BP 103/67 | HR 74 | Temp 99.1°F | Ht 64.75 in | Wt 142.0 lb

## 2013-07-25 DIAGNOSIS — B354 Tinea corporis: Secondary | ICD-10-CM

## 2013-07-25 MED ORDER — FLUCONAZOLE 100 MG PO TABS: 100.0000 mg | ORAL_TABLET | Freq: Two times a day (BID) | ORAL | Status: DC

## 2013-07-25 NOTE — Progress Notes (Signed)
   Subjective:    Patient ID: James Bradley, male    DOB: 29-Jan-1970, 43 y.o.   MRN: [id number]  HPI Here for 2 weeks of a widespread itchy rash on the trunk and arms. He feels fine in general.    Review of Systems  Constitutional: Negative.   HENT: Negative.   Eyes: Negative.   Respiratory: Negative.   Skin: Positive for rash.       Objective:   Physical Exam  Constitutional: He appears well-developed and well-nourished.  Skin:  Widespread red maculopapular areas as above, some have scaling           Assessment & Plan:  Recheck prn

## 2013-07-25 NOTE — Progress Notes (Signed)
Pre visit review using our clinic review tool, if applicable. No additional management support is needed unless otherwise documented below in the visit note. 

## 2013-07-29 ENCOUNTER — Other Ambulatory Visit: Payer: Self-pay

## 2013-08-17 ENCOUNTER — Encounter: Payer: Self-pay | Admitting: Family Medicine

## 2013-08-17 ENCOUNTER — Telehealth: Payer: Self-pay | Admitting: Family Medicine

## 2013-08-17 ENCOUNTER — Ambulatory Visit (INDEPENDENT_AMBULATORY_CARE_PROVIDER_SITE_OTHER): Payer: 59 | Admitting: Family Medicine

## 2013-08-17 VITALS — BP 109/72 | HR 81 | Temp 98.4°F | Ht 64.75 in | Wt 139.0 lb

## 2013-08-17 DIAGNOSIS — R21 Rash and other nonspecific skin eruption: Secondary | ICD-10-CM

## 2013-08-17 LAB — BASIC METABOLIC PANEL
BUN: 19 mg/dL (ref 6–23)
CO2: 28 mEq/L (ref 19–32)
Calcium: 10 mg/dL (ref 8.4–10.5)
Chloride: 100 mEq/L (ref 96–112)
Creatinine, Ser: 0.9 mg/dL (ref 0.4–1.5)
GFR: 103.77 mL/min (ref >60.00)
Glucose, Bld: 82 mg/dL (ref 70–99)
Potassium: 4.1 mEq/L (ref 3.5–5.1)
Sodium: 136 mEq/L (ref 135–145)

## 2013-08-17 LAB — HEPATIC FUNCTION PANEL
ALT: 33 U/L (ref 0–53)
AST: 29 U/L (ref 0–37)
Albumin: 5 g/dL (ref 3.5–5.2)
Alkaline Phosphatase: 39 U/L (ref 39–117)
Bilirubin, Direct: 0.2 mg/dL (ref 0.0–0.3)
Total Bilirubin: 1.4 mg/dL — ABNORMAL HIGH (ref 0.2–1.2)
Total Protein: 8 g/dL (ref 6.0–8.3)

## 2013-08-17 MED ORDER — PREDNISONE 5 MG PO TABS: 5.0000 mg | ORAL_TABLET | Freq: Every day | ORAL | Status: DC

## 2013-08-17 NOTE — Progress Notes (Signed)
Pre visit review using our clinic review tool, if applicable. No additional management support is needed unless otherwise documented below in the visit note. 

## 2013-08-17 NOTE — Telephone Encounter (Signed)
Relevant patient education assigned to patient using Emmi. ° °

## 2013-08-17 NOTE — Progress Notes (Signed)
   Subjective:    Patient ID: [facility], male    DOB: [birth year], 43 y.o.   MRN: [id number]  HPI Here to follow up some labs work ordered by Emiliano Dyer PA at [facility]. She saw him for a diffuse rash and a biopsy revealed this to be allergic in nature. She out him on a taper of prednisone, and the rash is fading away. He had labs drawn showing slight anemia with a Hgb of 12.5, but this is stable at his baseline. They also showed a slightly elevated AST and a calcium of 10.7. He feels fine today   Review of Systems  Constitutional: Negative.   Respiratory: Negative.   Cardiovascular: Negative.   Skin: Positive for rash.       Objective:   Physical Exam  Constitutional: He appears well-developed and well-nourished.  Cardiovascular: Normal rate, regular rhythm, normal heart sounds and intact distal pulses.   Pulmonary/Chest: Effort normal and breath sounds normal.          Assessment & Plan:  I do not think these lab abnormalities are of consequence. We will repeat them however today

## 2013-09-02 ENCOUNTER — Other Ambulatory Visit: Payer: Self-pay | Admitting: Family Medicine

## 2013-09-06 ENCOUNTER — Telehealth: Payer: Self-pay | Admitting: Family Medicine

## 2013-09-06 NOTE — Telephone Encounter (Signed)
Pt also said he need a rx for   [facility]

## 2013-09-06 NOTE — Telephone Encounter (Signed)
Pt request refill of the following:  ALPRAZolam (XANAX) 0.5 MG tablet, oxyCODONE-acetaminophen (PERCOCET) 10-325 MG per tablet     Phamacy:

## 2013-09-08 MED ORDER — OXYCODONE-ACETAMINOPHEN 10-325 MG PO TABS: 1.0000 | ORAL_TABLET | Freq: Four times a day (QID) | ORAL | Status: DC | PRN

## 2013-09-08 NOTE — Telephone Encounter (Signed)
Call in Xanax #90 with 5rf, also Ambien #30 with 5 rf. rx for Percocet ready to pick up

## 2013-09-08 NOTE — Telephone Encounter (Signed)
Call in Zolpidem #30 with 5 rf, also Xanax #90 with 5 rf

## 2013-09-09 MED ORDER — ALPRAZOLAM 0.5 MG PO TABS: 0.5000 mg | ORAL_TABLET | Freq: Three times a day (TID) | ORAL | Status: DC | PRN

## 2013-09-09 MED ORDER — ZOLPIDEM TARTRATE 10 MG PO TABS: 10.0000 mg | ORAL_TABLET | Freq: Every evening | ORAL | Status: DC | PRN

## 2013-09-09 NOTE — Telephone Encounter (Signed)
I left a voice message for pt, script for Percocet is ready for pick up and I called in the other 2 scripts.

## 2013-11-25 ENCOUNTER — Telehealth: Payer: Self-pay | Admitting: Family Medicine

## 2013-11-25 NOTE — Telephone Encounter (Signed)
Pt request refill of the following  oxyCODONE-acetaminophen (PERCOCET) 10-325 MG per tablet ° ° °Phamacy: ° °

## 2013-11-25 NOTE — Telephone Encounter (Signed)
Too early, not due until 12-09-13

## 2013-11-28 NOTE — Telephone Encounter (Signed)
I left a voice message advising pt to call and request this on 12/05/13 Monday, since the office is closed the 26 & 27.

## 2013-12-05 ENCOUNTER — Telehealth: Payer: Self-pay | Admitting: Family Medicine

## 2013-12-05 NOTE — Telephone Encounter (Signed)
Pt needs new rx oxycodone °

## 2013-12-06 MED ORDER — OXYCODONE-ACETAMINOPHEN 10-325 MG PO TABS
1.0000 | ORAL_TABLET | Freq: Four times a day (QID) | ORAL | Status: DC | PRN
Start: 2013-12-06 — End: 2013-12-06

## 2013-12-06 MED ORDER — OXYCODONE-ACETAMINOPHEN 10-325 MG PO TABS: 1.0000 | ORAL_TABLET | Freq: Four times a day (QID) | ORAL | Status: DC | PRN

## 2013-12-06 NOTE — Telephone Encounter (Signed)
Script is ready for pick up and I spoke with pt.  

## 2013-12-06 NOTE — Telephone Encounter (Signed)
done

## 2014-02-20 ENCOUNTER — Other Ambulatory Visit: Payer: Self-pay

## 2014-02-22 ENCOUNTER — Other Ambulatory Visit (INDEPENDENT_AMBULATORY_CARE_PROVIDER_SITE_OTHER): Payer: 59

## 2014-02-22 DIAGNOSIS — Z0000 Encounter for general adult medical examination without abnormal findings: Secondary | ICD-10-CM

## 2014-02-22 LAB — TSH: TSH: 0.7 u[IU]/mL (ref 0.35–4.50)

## 2014-02-22 LAB — COMPREHENSIVE METABOLIC PANEL
ALT: 57 U/L — ABNORMAL HIGH (ref 0–53)
AST: 40 U/L — ABNORMAL HIGH (ref 0–37)
Albumin: 4.9 g/dL (ref 3.5–5.2)
Alkaline Phosphatase: 49 U/L (ref 39–117)
BUN: 13 mg/dL (ref 6–23)
CO2: 29 mEq/L (ref 19–32)
Calcium: 9.7 mg/dL (ref 8.4–10.5)
Chloride: 100 mEq/L (ref 96–112)
Creatinine, Ser: 0.85 mg/dL (ref 0.40–1.50)
GFR: 103.52 mL/min (ref >60.00)
Glucose, Bld: 85 mg/dL (ref 70–99)
Potassium: 4 mEq/L (ref 3.5–5.1)
Sodium: 138 mEq/L (ref 135–145)
Total Bilirubin: 1 mg/dL (ref 0.2–1.2)
Total Protein: 7.2 g/dL (ref 6.0–8.3)

## 2014-02-22 LAB — CBC WITH DIFFERENTIAL/PLATELET
Basophils Absolute: 0 10*3/uL (ref 0.0–0.1)
Basophils Relative: 0.8 % (ref 0.0–3.0)
Eosinophils Absolute: 0 10*3/uL (ref 0.0–0.7)
Eosinophils Relative: 0.3 % (ref 0.0–5.0)
HCT: 38.9 % — ABNORMAL LOW (ref 39.0–52.0)
Hemoglobin: 12.7 g/dL — ABNORMAL LOW (ref 13.0–17.0)
Lymphocytes Relative: 49.7 % — ABNORMAL HIGH (ref 12.0–46.0)
Lymphs Abs: 2.5 10*3/uL (ref 0.7–4.0)
MCHC: 32.6 g/dL (ref 30.0–36.0)
MCV: 67.6 fl — ABNORMAL LOW (ref 78.0–100.0)
Monocytes Absolute: 0.3 10*3/uL (ref 0.1–1.0)
Monocytes Relative: 5.1 % (ref 3.0–12.0)
Neutro Abs: 2.2 10*3/uL (ref 1.4–7.7)
Neutrophils Relative %: 44.1 % (ref 43.0–77.0)
Platelets: 356 10*3/uL (ref 150.0–400.0)
RBC: 5.75 Mil/uL (ref 4.22–5.81)
RDW: 16.3 % — ABNORMAL HIGH (ref 11.5–15.5)
WBC: 5.1 10*3/uL (ref 4.0–10.5)

## 2014-02-22 LAB — POCT URINALYSIS DIPSTICK
Bilirubin, UA: NEGATIVE
Blood, UA: NEGATIVE
Glucose, UA: NEGATIVE
Leukocytes, UA: NEGATIVE
Nitrite, UA: NEGATIVE
Protein, UA: NEGATIVE
Spec Grav, UA: 1.01
Urobilinogen, UA: 0.2
pH, UA: 5.5

## 2014-02-22 LAB — LIPID PANEL
Cholesterol: 220 mg/dL — ABNORMAL HIGH (ref 0–200)
HDL: 97.2 mg/dL (ref >39.00)
LDL Cholesterol: 106 mg/dL — ABNORMAL HIGH (ref 0–99)
NonHDL: 122.8
Total CHOL/HDL Ratio: 2
Triglycerides: 83 mg/dL (ref 0.0–149.0)
VLDL: 16.6 mg/dL (ref 0.0–40.0)

## 2014-02-22 LAB — PSA: PSA: 0.02 ng/mL — ABNORMAL LOW (ref 0.10–4.00)

## 2014-02-28 ENCOUNTER — Ambulatory Visit (INDEPENDENT_AMBULATORY_CARE_PROVIDER_SITE_OTHER): Payer: 59 | Admitting: Family Medicine

## 2014-02-28 ENCOUNTER — Encounter: Payer: Self-pay | Admitting: Family Medicine

## 2014-02-28 VITALS — BP 116/74 | HR 81 | Temp 98.9°F | Ht 64.75 in | Wt 147.0 lb

## 2014-02-28 DIAGNOSIS — Z23 Encounter for immunization: Secondary | ICD-10-CM

## 2014-02-28 DIAGNOSIS — Z0000 Encounter for general adult medical examination without abnormal findings: Secondary | ICD-10-CM

## 2014-02-28 MED ORDER — OXYCODONE-ACETAMINOPHEN 10-325 MG PO TABS: 1.0000 | ORAL_TABLET | Freq: Four times a day (QID) | ORAL | Status: DC | PRN

## 2014-02-28 MED ORDER — ZOLPIDEM TARTRATE 10 MG PO TABS
10.0000 mg | ORAL_TABLET | Freq: Every evening | ORAL | Status: DC | PRN
Start: 2014-02-28 — End: 2014-08-23

## 2014-02-28 MED ORDER — ALPRAZOLAM 0.5 MG PO TABS: 0.5000 mg | ORAL_TABLET | Freq: Three times a day (TID) | ORAL | Status: DC | PRN

## 2014-02-28 NOTE — Progress Notes (Signed)
   Subjective:    Patient ID: [facility], male    DOB: 09-21-1970, 44 y.o.   MRN: [id number]  HPI 44 yr old male for a cpx. He feels great.    Review of Systems  Constitutional: Negative.   HENT: Negative.   Eyes: Negative.   Respiratory: Negative.   Cardiovascular: Negative.   Gastrointestinal: Negative.   Genitourinary: Negative.   Musculoskeletal: Negative.   Skin: Negative.   Neurological: Negative.   Psychiatric/Behavioral: Negative.        Objective:   Physical Exam  Constitutional: He is oriented to person, place, and time. He appears well-developed and well-nourished. No distress.  HENT:  Head: Normocephalic and atraumatic.  Right Ear: External ear normal.  Left Ear: External ear normal.  Nose: Nose normal.  Mouth/Throat: Oropharynx is clear and moist. No oropharyngeal exudate.  Eyes: Conjunctivae and EOM are normal. Pupils are equal, round, and reactive to light. Right eye exhibits no discharge. Left eye exhibits no discharge. No scleral icterus.  Neck: Neck supple. No JVD present. No tracheal deviation present. No thyromegaly present.  Cardiovascular: Normal rate, regular rhythm, normal heart sounds and intact distal pulses.  Exam reveals no gallop and no friction rub.   No murmur heard. Pulmonary/Chest: Effort normal and breath sounds normal. No respiratory distress. He has no wheezes. He has no rales. He exhibits no tenderness.  Abdominal: Soft. Bowel sounds are normal. He exhibits no distension and no mass. There is no tenderness. There is no rebound and no guarding.  Genitourinary: Rectum normal, prostate normal and penis normal. Guaiac negative stool. No penile tenderness.  Musculoskeletal: Normal range of motion. He exhibits no edema or tenderness.  Lymphadenopathy:    He has no cervical adenopathy.  Neurological: He is alert and oriented to person, place, and time. He has normal reflexes. No cranial nerve deficit. He exhibits normal muscle tone.  Coordination normal.  Skin: Skin is warm and dry. No rash noted. He is not diaphoretic. No erythema. No pallor.  Psychiatric: He has a normal mood and affect. His behavior is normal. Judgment and thought content normal.          Assessment & Plan:  Well exam.

## 2014-02-28 NOTE — Progress Notes (Signed)
Pre visit review using our clinic review tool, if applicable. No additional management support is needed unless otherwise documented below in the visit note. 

## 2014-04-04 ENCOUNTER — Telehealth: Payer: Self-pay | Admitting: Family Medicine

## 2014-04-04 MED ORDER — PREDNISONE 5 MG PO TABS: 5.0000 mg | ORAL_TABLET | Freq: Every day | ORAL | Status: DC

## 2014-04-04 NOTE — Telephone Encounter (Signed)
Per Dr. Sarajane Jews okay to refill # 10. I did send script e-scribe and spoke with [place].

## 2014-04-04 NOTE — Telephone Encounter (Signed)
Patient's wife states patient is breaking out with a rash on his body again.  He would like to know if Dr. Sarajane Jews can send predniSONE (DELTASONE) 5 MG tablet to [facility] on [address].

## 2014-05-26 ENCOUNTER — Telehealth: Payer: Self-pay | Admitting: Family Medicine

## 2014-05-26 NOTE — Telephone Encounter (Signed)
Pt request refill oxyCODONE-acetaminophen (PERCOCET) 10-325 MG per tablet

## 2014-05-29 MED ORDER — OXYCODONE-ACETAMINOPHEN 10-325 MG PO TABS: 1.0000 | ORAL_TABLET | Freq: Four times a day (QID) | ORAL | Status: DC | PRN

## 2014-05-29 NOTE — Telephone Encounter (Signed)
done

## 2014-05-29 NOTE — Telephone Encounter (Signed)
rx up front for p/u, pt aware

## 2014-08-22 ENCOUNTER — Telehealth: Payer: Self-pay | Admitting: Family Medicine

## 2014-08-22 NOTE — Telephone Encounter (Signed)
Pt request refill of the following: oxyCODONE-acetaminophen (PERCOCET) 10-325 MG per tablet   zolpidem (AMBIEN) 10 MG tablet ALPRAZolam (XANAX) 0.5 MG tablet      Phamacy:  [place]

## 2014-08-23 MED ORDER — OXYCODONE-ACETAMINOPHEN 10-325 MG PO TABS: 1.0000 | ORAL_TABLET | Freq: Four times a day (QID) | ORAL | Status: DC | PRN

## 2014-08-23 MED ORDER — ZOLPIDEM TARTRATE 10 MG PO TABS: 10.0000 mg | ORAL_TABLET | Freq: Every evening | ORAL | Status: DC | PRN

## 2014-08-23 MED ORDER — ALPRAZOLAM 0.5 MG PO TABS: 0.5000 mg | ORAL_TABLET | Freq: Three times a day (TID) | ORAL | Status: DC | PRN

## 2014-08-23 NOTE — Telephone Encounter (Signed)
Script for Percocet is ready for pick up here at front office, I called in other 2 scripts and left a voice message for pt.

## 2014-08-23 NOTE — Telephone Encounter (Signed)
Ok to fill for one month.   Percocet can be written for one month and to fill 08/29/2014

## 2014-08-23 NOTE — Telephone Encounter (Signed)
Pt was last seen here in office on 02/28/14. Ambien & Xanax is due now, the Percocet is due on 08/29/14.

## 2014-09-25 ENCOUNTER — Telehealth: Payer: Self-pay | Admitting: Family Medicine

## 2014-09-25 MED ORDER — OXYCODONE-ACETAMINOPHEN 10-325 MG PO TABS: 1.0000 | ORAL_TABLET | Freq: Four times a day (QID) | ORAL | Status: DC | PRN

## 2014-09-25 NOTE — Telephone Encounter (Signed)
done

## 2014-09-25 NOTE — Telephone Encounter (Signed)
Pt need new rx oxycodone °

## 2014-09-26 NOTE — Telephone Encounter (Signed)
Script is ready for pick up and I left a message for pt. 

## 2014-09-27 ENCOUNTER — Ambulatory Visit (INDEPENDENT_AMBULATORY_CARE_PROVIDER_SITE_OTHER): Payer: 59 | Admitting: Family Medicine

## 2014-09-27 ENCOUNTER — Other Ambulatory Visit: Payer: Self-pay | Admitting: Adult Health

## 2014-09-27 ENCOUNTER — Encounter: Payer: Self-pay | Admitting: Family Medicine

## 2014-09-27 VITALS — BP 112/69 | HR 81 | Temp 98.5°F | Ht 64.75 in | Wt 146.0 lb

## 2014-09-27 DIAGNOSIS — F411 Generalized anxiety disorder: Secondary | ICD-10-CM

## 2014-09-27 MED ORDER — ZOLPIDEM TARTRATE 10 MG PO TABS: 10.0000 mg | ORAL_TABLET | Freq: Every evening | ORAL | Status: DC | PRN

## 2014-09-27 MED ORDER — ALPRAZOLAM 1 MG PO TABS
1.0000 mg | ORAL_TABLET | Freq: Three times a day (TID) | ORAL | Status: DC | PRN
Start: 2014-09-27 — End: 2015-03-05

## 2014-09-27 NOTE — Progress Notes (Signed)
   Subjective:    Patient ID: [facility], male    DOB: September 28, 1970, 44 y.o.   MRN: [id number]  HPI Here for one month of intermittent symptoms that he thinks are coming from anxiety. He describes chest tightness that comes when he is in a crowd of people or in a stressful situation. No chest pains. These never happen during exercise. His BP has been stable.    Review of Systems  Constitutional: Negative.   Respiratory: Positive for chest tightness and shortness of breath. Negative for cough and wheezing.   Cardiovascular: Positive for chest pain.  Neurological: Negative.        Objective:   Physical Exam  Constitutional: He is oriented to person, place, and time. He appears well-developed and well-nourished. No distress.  Neck: No thyromegaly present.  Cardiovascular: Normal rate, regular rhythm, normal heart sounds and intact distal pulses.   Pulmonary/Chest: Effort normal and breath sounds normal.  Lymphadenopathy:    He has no cervical adenopathy.  Neurological: He is alert and oriented to person, place, and time.          Assessment & Plan:  I agree these symptoms are probably related to anxiety. We will increase the dose of his Xanax to 1 mg tid. Recheck prn

## 2014-09-27 NOTE — Progress Notes (Signed)
Pre visit review using our clinic review tool, if applicable. No additional management support is needed unless otherwise documented below in the visit note. 

## 2014-12-22 ENCOUNTER — Telehealth: Payer: Self-pay | Admitting: Family Medicine

## 2014-12-22 MED ORDER — OXYCODONE-ACETAMINOPHEN 10-325 MG PO TABS: 1.0000 | ORAL_TABLET | Freq: Four times a day (QID) | ORAL | Status: DC | PRN

## 2014-12-22 NOTE — Telephone Encounter (Signed)
James [facility] called saying he needs a Rx for Oxycodone. Please give him a call if you have questions or concerns.  Pt's ph# [phone] Thank you.

## 2014-12-22 NOTE — Telephone Encounter (Signed)
done

## 2014-12-25 NOTE — Telephone Encounter (Signed)
Script is ready for pick up and I spoke with pt.  

## 2015-02-26 ENCOUNTER — Other Ambulatory Visit (INDEPENDENT_AMBULATORY_CARE_PROVIDER_SITE_OTHER): Payer: 59

## 2015-02-26 DIAGNOSIS — Z0000 Encounter for general adult medical examination without abnormal findings: Secondary | ICD-10-CM

## 2015-02-26 LAB — CBC WITH DIFFERENTIAL/PLATELET
HCT: 39.5 % (ref 39.0–52.0)
Hemoglobin: 12.5 g/dL — ABNORMAL LOW (ref 13.0–17.0)
MCHC: 31.6 g/dL (ref 30.0–36.0)
MCV: 71.3 fl — ABNORMAL LOW (ref 78.0–100.0)
Platelets: 288 10*3/uL (ref 150.0–400.0)
RBC: 5.53 Mil/uL (ref 4.22–5.81)
RDW: 17.3 % — ABNORMAL HIGH (ref 11.5–15.5)
WBC: 4.9 10*3/uL (ref 4.0–10.5)

## 2015-02-26 LAB — HEPATIC FUNCTION PANEL
ALT: 24 U/L (ref 0–53)
AST: 24 U/L (ref 0–37)
Albumin: 5.2 g/dL (ref 3.5–5.2)
Alkaline Phosphatase: 41 U/L (ref 39–117)
Bilirubin, Direct: 0.1 mg/dL (ref 0.0–0.3)
Total Bilirubin: 0.8 mg/dL (ref 0.2–1.2)
Total Protein: 7.6 g/dL (ref 6.0–8.3)

## 2015-02-26 LAB — LIPID PANEL
Cholesterol: 201 mg/dL — ABNORMAL HIGH (ref 0–200)
HDL: 91.6 mg/dL (ref >39.00)
LDL Cholesterol: 76 mg/dL (ref 0–99)
NonHDL: 109.45
Total CHOL/HDL Ratio: 2
Triglycerides: 168 mg/dL — ABNORMAL HIGH (ref 0.0–149.0)
VLDL: 33.6 mg/dL (ref 0.0–40.0)

## 2015-02-26 LAB — BASIC METABOLIC PANEL
BUN: 12 mg/dL (ref 6–23)
CO2: 32 mEq/L (ref 19–32)
Calcium: 10.1 mg/dL (ref 8.4–10.5)
Chloride: 99 mEq/L (ref 96–112)
Creatinine, Ser: 0.9 mg/dL (ref 0.40–1.50)
GFR: 96.48 mL/min (ref >60.00)
Glucose, Bld: 96 mg/dL (ref 70–99)
Potassium: 4.1 mEq/L (ref 3.5–5.1)
Sodium: 140 mEq/L (ref 135–145)

## 2015-02-26 LAB — TSH: TSH: 0.72 u[IU]/mL (ref 0.35–4.50)

## 2015-02-26 LAB — POC URINALSYSI DIPSTICK (AUTOMATED)
Bilirubin, UA: NEGATIVE
Blood, UA: NEGATIVE
Glucose, UA: NEGATIVE
Ketones, UA: NEGATIVE
Leukocytes, UA: NEGATIVE
Nitrite, UA: NEGATIVE
Protein, UA: NEGATIVE
Spec Grav, UA: 1.015
Urobilinogen, UA: 0.2
pH, UA: 6

## 2015-02-26 LAB — PSA: PSA: 0.03 ng/mL — ABNORMAL LOW (ref 0.10–4.00)

## 2015-03-05 ENCOUNTER — Encounter: Payer: Self-pay | Admitting: Family Medicine

## 2015-03-05 ENCOUNTER — Ambulatory Visit (INDEPENDENT_AMBULATORY_CARE_PROVIDER_SITE_OTHER): Payer: 59 | Admitting: Family Medicine

## 2015-03-05 VITALS — BP 107/72 | HR 80 | Temp 98.2°F | Ht 64.75 in | Wt 151.0 lb

## 2015-03-05 DIAGNOSIS — Z0000 Encounter for general adult medical examination without abnormal findings: Secondary | ICD-10-CM | POA: Diagnosis not present

## 2015-03-05 MED ORDER — ALPRAZOLAM 1 MG PO TABS: 1.0000 mg | ORAL_TABLET | Freq: Three times a day (TID) | ORAL | Status: DC | PRN

## 2015-03-05 MED ORDER — OXYCODONE-ACETAMINOPHEN 10-325 MG PO TABS: 1.0000 | ORAL_TABLET | Freq: Four times a day (QID) | ORAL | Status: DC | PRN

## 2015-03-05 MED ORDER — ZOLPIDEM TARTRATE 10 MG PO TABS: 10.0000 mg | ORAL_TABLET | Freq: Every evening | ORAL | Status: DC | PRN

## 2015-03-05 NOTE — Progress Notes (Signed)
   Subjective:    Patient ID: James [facility], male    DOB: 16-Jul-1970, 45 y.o.   MRN: [id number]  HPI 45 yr old male for a cpx. He feels well.    Review of Systems  Constitutional: Negative.   HENT: Negative.   Eyes: Negative.   Respiratory: Negative.   Cardiovascular: Negative.   Gastrointestinal: Negative.   Genitourinary: Negative.   Musculoskeletal: Negative.   Skin: Negative.   Neurological: Negative.   Psychiatric/Behavioral: Negative.        Objective:   Physical Exam  Constitutional: He is oriented to person, place, and time. He appears well-developed and well-nourished. No distress.  HENT:  Head: Normocephalic and atraumatic.  Right Ear: External ear normal.  Left Ear: External ear normal.  Nose: Nose normal.  Mouth/Throat: Oropharynx is clear and moist. No oropharyngeal exudate.  Eyes: Conjunctivae and EOM are normal. Pupils are equal, round, and reactive to light. Right eye exhibits no discharge. Left eye exhibits no discharge. No scleral icterus.  Neck: Neck supple. No JVD present. No tracheal deviation present. No thyromegaly present.  Cardiovascular: Normal rate, regular rhythm, normal heart sounds and intact distal pulses.  Exam reveals no gallop and no friction rub.   No murmur heard. Pulmonary/Chest: Effort normal and breath sounds normal. No respiratory distress. He has no wheezes. He has no rales. He exhibits no tenderness.  Abdominal: Soft. Bowel sounds are normal. He exhibits no distension and no mass. There is no tenderness. There is no rebound and no guarding.  Genitourinary: Rectum normal, prostate normal and penis normal. Guaiac negative stool. No penile tenderness.  Musculoskeletal: Normal range of motion. He exhibits no edema or tenderness.  Lymphadenopathy:    He has no cervical adenopathy.  Neurological: He is alert and oriented to person, place, and time. He has normal reflexes. No cranial nerve deficit. He exhibits normal muscle tone.  Coordination normal.  Skin: Skin is warm and dry. No rash noted. He is not diaphoretic. No erythema. No pallor.  Psychiatric: He has a normal mood and affect. His behavior is normal. Judgment and thought content normal.          Assessment & Plan:  Well exam. We discussed diet and exercise.

## 2015-03-05 NOTE — Progress Notes (Signed)
Pre visit review using our clinic review tool, if applicable. No additional management support is needed unless otherwise documented below in the visit note. 

## 2015-03-06 ENCOUNTER — Encounter: Payer: Self-pay | Admitting: Family Medicine

## 2015-06-26 ENCOUNTER — Telehealth: Payer: Self-pay | Admitting: Family Medicine

## 2015-06-26 NOTE — Telephone Encounter (Signed)
° ° ° ° °

## 2015-06-27 MED ORDER — OXYCODONE-ACETAMINOPHEN 10-325 MG PO TABS: 1.0000 | ORAL_TABLET | Freq: Four times a day (QID) | ORAL | Status: DC | PRN

## 2015-06-27 NOTE — Telephone Encounter (Signed)
Patient notified that rx is ready for pick up

## 2015-06-27 NOTE — Telephone Encounter (Signed)
done

## 2015-09-18 ENCOUNTER — Telehealth: Payer: Self-pay | Admitting: Family Medicine

## 2015-09-18 NOTE — Telephone Encounter (Signed)
Call in Xanax #90 with 5 rf. Call in Ambien #30 with 5 rf. The Percocet is not due until 09-27-15

## 2015-09-18 NOTE — Telephone Encounter (Signed)
Pt need new Rx for Xanax and Ambien   Pharm:  [facility].   Pt also need Oxycodone.

## 2015-09-19 MED ORDER — ALPRAZOLAM 1 MG PO TABS: 1.0000 mg | ORAL_TABLET | Freq: Three times a day (TID) | ORAL | 5 refills | Status: DC | PRN

## 2015-09-19 MED ORDER — ZOLPIDEM TARTRATE 10 MG PO TABS: 10.0000 mg | ORAL_TABLET | Freq: Every evening | ORAL | 5 refills | Status: DC | PRN

## 2015-09-19 NOTE — Telephone Encounter (Signed)
I printed 2 scripts and faxed to [facility], I tried to reach pt and give below message, no answer. Pt can request medication 3 business days before due.

## 2015-09-20 ENCOUNTER — Other Ambulatory Visit: Payer: Self-pay | Admitting: Family Medicine

## 2015-09-23 ENCOUNTER — Other Ambulatory Visit: Payer: Self-pay | Admitting: Family Medicine

## 2015-09-24 NOTE — Telephone Encounter (Signed)
We did this last week  

## 2015-09-25 ENCOUNTER — Telehealth: Payer: Self-pay | Admitting: Family Medicine

## 2015-09-25 NOTE — Telephone Encounter (Signed)
° ° °  Pt call and [facility] told him they did not have the rx for  [facility] he will also need his rx for  oxyCODONE-acetaminophen (PERCOCET) 10-325 MG tablet that is due 09/27/15     [place]

## 2015-09-26 ENCOUNTER — Other Ambulatory Visit: Payer: Self-pay | Admitting: Family Medicine

## 2015-09-26 MED ORDER — OXYCODONE-ACETAMINOPHEN 10-325 MG PO TABS: 1.0000 | ORAL_TABLET | Freq: Four times a day (QID) | ORAL | 0 refills | Status: DC | PRN

## 2015-09-26 NOTE — Telephone Encounter (Signed)
I spoke with pt, script for Percocet are ready for pick up here at front office, I did speak with pharmacy and they will get the Xanax ready.

## 2015-09-26 NOTE — Telephone Encounter (Signed)
The rx for Percocet are ready. We already sent in a 6 month supply of Xanax on 09-19-15

## 2015-12-24 ENCOUNTER — Telehealth: Payer: Self-pay | Admitting: Family Medicine

## 2015-12-24 NOTE — Telephone Encounter (Signed)
Pt request refill  °oxyCODONE-acetaminophen (PERCOCET) 10-325 MG tablet °

## 2015-12-25 MED ORDER — OXYCODONE-ACETAMINOPHEN 10-325 MG PO TABS: 1.0000 | ORAL_TABLET | Freq: Four times a day (QID) | ORAL | 0 refills | Status: DC | PRN

## 2015-12-25 NOTE — Telephone Encounter (Signed)
Script is ready for pick up here at front office and I spoke with [facility].  

## 2015-12-25 NOTE — Telephone Encounter (Signed)
done

## 2016-02-26 ENCOUNTER — Other Ambulatory Visit (INDEPENDENT_AMBULATORY_CARE_PROVIDER_SITE_OTHER): Payer: 59

## 2016-02-26 DIAGNOSIS — Z0000 Encounter for general adult medical examination without abnormal findings: Secondary | ICD-10-CM

## 2016-02-27 LAB — CBC WITH DIFFERENTIAL/PLATELET
Basophils Absolute: 0 10*3/uL (ref 0.0–0.1)
Basophils Relative: 1.3 % (ref 0.0–3.0)
Eosinophils Absolute: 0 10*3/uL (ref 0.0–0.7)
Eosinophils Relative: 0.5 % (ref 0.0–5.0)
HCT: 40.5 % (ref 39.0–52.0)
Hemoglobin: 13 g/dL (ref 13.0–17.0)
Lymphocytes Relative: 38.6 % (ref 12.0–46.0)
Lymphs Abs: 1.4 10*3/uL (ref 0.7–4.0)
MCHC: 32 g/dL (ref 30.0–36.0)
MCV: 70.7 fl — ABNORMAL LOW (ref 78.0–100.0)
Monocytes Absolute: 0.2 10*3/uL (ref 0.1–1.0)
Monocytes Relative: 6.4 % (ref 3.0–12.0)
Neutro Abs: 2 10*3/uL (ref 1.4–7.7)
Neutrophils Relative %: 53.2 % (ref 43.0–77.0)
Platelets: 279 10*3/uL (ref 150.0–400.0)
RBC: 5.73 Mil/uL (ref 4.22–5.81)
RDW: 16.5 % — ABNORMAL HIGH (ref 11.5–15.5)
WBC: 3.7 10*3/uL — ABNORMAL LOW (ref 4.0–10.5)

## 2016-02-27 LAB — HEPATIC FUNCTION PANEL
ALT: 48 U/L (ref 0–53)
AST: 36 U/L (ref 0–37)
Albumin: 5.1 g/dL (ref 3.5–5.2)
Alkaline Phosphatase: 43 U/L (ref 39–117)
Bilirubin, Direct: 0.2 mg/dL (ref 0.0–0.3)
Total Bilirubin: 1.1 mg/dL (ref 0.2–1.2)
Total Protein: 7.3 g/dL (ref 6.0–8.3)

## 2016-02-27 LAB — BASIC METABOLIC PANEL
BUN: 9 mg/dL (ref 6–23)
CO2: 29 mEq/L (ref 19–32)
Calcium: 9.8 mg/dL (ref 8.4–10.5)
Chloride: 101 mEq/L (ref 96–112)
Creatinine, Ser: 0.86 mg/dL (ref 0.40–1.50)
GFR: 101.24 mL/min (ref >60.00)
Glucose, Bld: 95 mg/dL (ref 70–99)
Potassium: 4 mEq/L (ref 3.5–5.1)
Sodium: 139 mEq/L (ref 135–145)

## 2016-02-27 LAB — TSH: TSH: 0.55 u[IU]/mL (ref 0.35–4.50)

## 2016-02-27 LAB — LIPID PANEL
Cholesterol: 202 mg/dL — ABNORMAL HIGH (ref 0–200)
HDL: 92.3 mg/dL (ref >39.00)
LDL Cholesterol: 87 mg/dL (ref 0–99)
NonHDL: 109.6
Total CHOL/HDL Ratio: 2
Triglycerides: 112 mg/dL (ref 0.0–149.0)
VLDL: 22.4 mg/dL (ref 0.0–40.0)

## 2016-02-27 LAB — PSA: PSA: 0.03 ng/mL — ABNORMAL LOW (ref 0.10–4.00)

## 2016-03-04 ENCOUNTER — Encounter: Payer: Self-pay | Admitting: Family Medicine

## 2016-03-04 ENCOUNTER — Ambulatory Visit (INDEPENDENT_AMBULATORY_CARE_PROVIDER_SITE_OTHER): Payer: 59 | Admitting: Family Medicine

## 2016-03-04 VITALS — BP 118/74 | HR 73 | Temp 98.3°F | Ht 64.5 in | Wt 157.3 lb

## 2016-03-04 DIAGNOSIS — Z0000 Encounter for general adult medical examination without abnormal findings: Secondary | ICD-10-CM

## 2016-03-04 MED ORDER — TADALAFIL 20 MG PO TABS: 20.0000 mg | ORAL_TABLET | Freq: Every day | ORAL | 11 refills | Status: DC | PRN

## 2016-03-04 MED ORDER — OXYCODONE-ACETAMINOPHEN 10-325 MG PO TABS: 1.0000 | ORAL_TABLET | Freq: Four times a day (QID) | ORAL | 0 refills | Status: DC | PRN

## 2016-03-04 MED ORDER — ALPRAZOLAM 1 MG PO TABS: 1.0000 mg | ORAL_TABLET | Freq: Three times a day (TID) | ORAL | 5 refills | Status: DC | PRN

## 2016-03-04 MED ORDER — ZOLPIDEM TARTRATE 10 MG PO TABS: 10.0000 mg | ORAL_TABLET | Freq: Every evening | ORAL | 5 refills | Status: DC | PRN

## 2016-03-04 NOTE — Progress Notes (Signed)
   Subjective:    Patient ID: James [facility], male    DOB: 1970/10/03, 46 y.o.   MRN: [id number]  HPI 46 yr old male for a well exam. He feels fine. He wants to switch from daily 5 mg Cialis to prn dosing.    Review of Systems  Constitutional: Negative.   HENT: Negative.   Eyes: Negative.   Respiratory: Negative.   Cardiovascular: Negative.   Gastrointestinal: Negative.   Genitourinary: Negative.   Musculoskeletal: Negative.   Skin: Negative.   Neurological: Negative.   Psychiatric/Behavioral: Negative.        Objective:   Physical Exam  Constitutional: He is oriented to person, place, and time. He appears well-developed and well-nourished. No distress.  HENT:  Head: Normocephalic and atraumatic.  Right Ear: External ear normal.  Left Ear: External ear normal.  Nose: Nose normal.  Mouth/Throat: Oropharynx is clear and moist. No oropharyngeal exudate.  Eyes: Conjunctivae and EOM are normal. Pupils are equal, round, and reactive to light. Right eye exhibits no discharge. Left eye exhibits no discharge. No scleral icterus.  Neck: Neck supple. No JVD present. No tracheal deviation present. No thyromegaly present.  Cardiovascular: Normal rate, regular rhythm, normal heart sounds and intact distal pulses.  Exam reveals no gallop and no friction rub.   No murmur heard. Pulmonary/Chest: Effort normal and breath sounds normal. No respiratory distress. He has no wheezes. He has no rales. He exhibits no tenderness.  Abdominal: Soft. Bowel sounds are normal. He exhibits no distension and no mass. There is no tenderness. There is no rebound and no guarding.  Genitourinary: Rectum normal and penis normal. Rectal exam shows guaiac negative stool. No penile tenderness.  Genitourinary Comments: Prostate is absent   Musculoskeletal: Normal range of motion. He exhibits no edema or tenderness.  Lymphadenopathy:    He has no cervical adenopathy.  Neurological: He is alert and oriented to  person, place, and time. He has normal reflexes. No cranial nerve deficit. He exhibits normal muscle tone. Coordination normal.  Skin: Skin is warm and dry. No rash noted. He is not diaphoretic. No erythema. No pallor.  Psychiatric: He has a normal mood and affect. His behavior is normal. Judgment and thought content normal.          Assessment & Plan:  Well exam. We discussed diet and exercise.  Alysia Penna, MD

## 2016-03-17 ENCOUNTER — Ambulatory Visit: Payer: 59 | Admitting: Family Medicine

## 2016-03-17 ENCOUNTER — Ambulatory Visit (INDEPENDENT_AMBULATORY_CARE_PROVIDER_SITE_OTHER): Payer: 59 | Admitting: Family Medicine

## 2016-03-17 VITALS — BP 126/89 | HR 92 | Temp 98.8°F | Ht 64.5 in | Wt 150.0 lb

## 2016-03-17 DIAGNOSIS — F418 Other specified anxiety disorders: Secondary | ICD-10-CM

## 2016-03-17 DIAGNOSIS — R32 Unspecified urinary incontinence: Secondary | ICD-10-CM | POA: Diagnosis not present

## 2016-03-17 DIAGNOSIS — N3941 Urge incontinence: Secondary | ICD-10-CM | POA: Diagnosis not present

## 2016-03-17 LAB — POC URINALSYSI DIPSTICK (AUTOMATED)
Bilirubin, UA: NEGATIVE
Blood, UA: NEGATIVE
Glucose, UA: NEGATIVE
Leukocytes, UA: NEGATIVE
Nitrite, UA: NEGATIVE
Spec Grav, UA: >=1.03
Urobilinogen, UA: 0.2
pH, UA: 5.5

## 2016-03-17 MED ORDER — OXYBUTYNIN CHLORIDE ER 10 MG PO TB24: 10.0000 mg | ORAL_TABLET | Freq: Every day | ORAL | 2 refills | Status: DC

## 2016-03-17 MED ORDER — FLUOXETINE HCL 20 MG PO TABS: 20.0000 mg | ORAL_TABLET | Freq: Every day | ORAL | 3 refills | Status: DC

## 2016-03-17 NOTE — Patient Instructions (Signed)
WE NOW OFFER   [facility] FAST TRACK!!!  SAME DAY Appointments for ACUTE CARE  Such as: Sprains, Injuries, cuts, abrasions, rashes, muscle pain, joint pain, back pain Colds, flu, sore throats, headache, allergies, cough, fever  Ear pain, sinus and eye infections Abdominal pain, nausea, vomiting, diarrhea, upset stomach Animal/insect bites  3 Easy Ways to Schedule: Walk-In Scheduling Call in scheduling Mychart Sign-up: [email or web address]         

## 2016-03-17 NOTE — Progress Notes (Signed)
Pre visit review using our clinic review tool, if applicable. No additional management support is needed unless otherwise documented below in the visit note. 

## 2016-03-18 ENCOUNTER — Encounter: Payer: Self-pay | Admitting: Family Medicine

## 2016-03-18 DIAGNOSIS — F418 Other specified anxiety disorders: Secondary | ICD-10-CM | POA: Insufficient documentation

## 2016-03-18 DIAGNOSIS — R32 Unspecified urinary incontinence: Secondary | ICD-10-CM | POA: Insufficient documentation

## 2016-03-18 NOTE — Progress Notes (Signed)
   Subjective:    Patient ID: James [facility], male    DOB: [birth year], 46 y.o.   MRN: [id number]  HPI Here for several issues. First he has been dealing with a lot of depression for the past month. He describes hopelessness and sadness, some crying spells. He has also had a lot of anxiety with worrying about things. He denies any suicidal thoughts. He attributes most of his stress ot his job which is quite demanding. He has trouble sleeping and his appetite is down. 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 Needless to say this has been extremely embarrassing to him and he has had to go home to change clothes. He feel she needs to take a break from his job while we work on these issues. He has been out of work since 03-10-16.    Review of Systems  Constitutional: Negative.   Respiratory: Negative.   Cardiovascular: Negative.   Gastrointestinal: Negative.   Genitourinary: Negative for difficulty urinating, dysuria, flank pain, frequency, hematuria, testicular pain and urgency.  Neurological: Negative.   Psychiatric/Behavioral: Positive for decreased concentration, dysphoric mood and sleep disturbance. Negative for agitation, behavioral problems, confusion, self-injury and suicidal ideas. The patient is nervous/anxious.        Objective:   Physical Exam  Constitutional: He appears well-developed and well-nourished.  Cardiovascular: Normal rate, regular rhythm, normal heart sounds and intact distal pulses.   Pulmonary/Chest: Effort normal and breath sounds normal.  Abdominal: Soft. Bowel sounds are normal. He exhibits no distension and no mass. There is no tenderness. There is no rebound and no guarding.  Psychiatric: His behavior is normal. Judgment and thought content normal.  Very anxious but appropriate           Assessment & Plan:  He has depression and anxiety, and these seem  to be exacerbated by his job. He also has urinary incontinence which has made it difficult for him to work. We will check a UA today to rule out infection. He will try Oxybutynin for the incontinence. He will start on Prozac 20 mg daily and recheck with [place] in one week. Written out of work from 03-10-16 to 03-31-16.  Alysia Penna, MD

## 2016-03-26 ENCOUNTER — Ambulatory Visit: Payer: 59 | Admitting: Family Medicine

## 2016-03-27 ENCOUNTER — Ambulatory Visit (INDEPENDENT_AMBULATORY_CARE_PROVIDER_SITE_OTHER): Payer: 59 | Admitting: Family Medicine

## 2016-03-27 ENCOUNTER — Encounter: Payer: Self-pay | Admitting: Family Medicine

## 2016-03-27 VITALS — BP 120/82 | HR 113 | Temp 98.4°F | Ht 64.5 in | Wt 143.0 lb

## 2016-03-27 DIAGNOSIS — N3942 Incontinence without sensory awareness: Secondary | ICD-10-CM

## 2016-03-27 DIAGNOSIS — F418 Other specified anxiety disorders: Secondary | ICD-10-CM

## 2016-03-27 MED ORDER — FLUOXETINE HCL 40 MG PO CAPS: 40.0000 mg | ORAL_CAPSULE | Freq: Every day | ORAL | 2 refills | Status: DC

## 2016-03-27 NOTE — Progress Notes (Signed)
Pre visit review using our clinic review tool, if applicable. No additional management support is needed unless otherwise documented below in the visit note. 

## 2016-03-27 NOTE — Patient Instructions (Signed)
WE NOW OFFER   [facility] FAST TRACK!!!  SAME DAY Appointments for ACUTE CARE  Such as: Sprains, Injuries, cuts, abrasions, rashes, muscle pain, joint pain, back pain Colds, flu, sore throats, headache, allergies, cough, fever  Ear pain, sinus and eye infections Abdominal pain, nausea, vomiting, diarrhea, upset stomach Animal/insect bites  3 Easy Ways to Schedule: Walk-In Scheduling Call in scheduling Mychart Sign-up: [email or web address]         

## 2016-03-27 NOTE — Progress Notes (Signed)
   Subjective:    Patient ID: [facility], male    DOB: Jun 20, 1970, 46 y.o.   MRN: [id number]  HPI Here to follow up on depression with anxiety and urinary incontinence. Last week we started him on Prozac 20 mg daily along with prn Xanax. He started on Oxybutynin as well. He has been out of work since 03-10-16. Today he reports that his depression is no better at all. He still feels very sad and hopeless, and he occasionally hears voices that he knows are not real. He still has trouble with sleep and appetite. No suicidal thoughts. He takes Xanax 3-4 times a day. He is very pleased with how the Oxybutynin works however, and he has total bladder control now.    Review of Systems  Constitutional: Negative.   Respiratory: Negative.   Cardiovascular: Negative.   Genitourinary: Negative.   Neurological: Negative.   Psychiatric/Behavioral: Positive for decreased concentration, dysphoric mood, hallucinations and sleep disturbance. Negative for agitation, behavioral problems, confusion, self-injury and suicidal ideas. The patient is nervous/anxious.        Objective:   Physical Exam  Constitutional: He is oriented to person, place, and time. He appears well-developed and well-nourished.  Cardiovascular: Normal rate, regular rhythm, normal heart sounds and intact distal pulses.   Pulmonary/Chest: Effort normal and breath sounds normal.  Neurological: He is alert and oriented to person, place, and time.  Psychiatric: His behavior is normal. Judgment and thought content normal.  Depressed affect, good eye contact           Assessment & Plan:  His depression is not better on Prozac 20 mg a day, and he is now having some psychosis with it. We both agreed he needs to see a Psychiatrist asap, and he will check with his insurance about who is in his network locally. He will contact [facility] to begin seeing a therapist. In the meantime we will increase the Prozac dose to 40 mg  daily. I have extended his work excuse out so he will return to work on 04-28-16. His bladder incontinence is now well controlled. We spent 45 minutes face to face discussing these issues today.  Alysia Penna, MD

## 2016-04-12 ENCOUNTER — Other Ambulatory Visit: Payer: Self-pay | Admitting: Family Medicine

## 2016-04-14 ENCOUNTER — Encounter: Payer: Self-pay | Admitting: Family Medicine

## 2016-04-14 ENCOUNTER — Ambulatory Visit (INDEPENDENT_AMBULATORY_CARE_PROVIDER_SITE_OTHER): Payer: 59 | Admitting: Family Medicine

## 2016-04-14 VITALS — BP 155/99 | HR 99 | Temp 98.6°F | Ht 64.5 in | Wt 138.0 lb

## 2016-04-14 DIAGNOSIS — F418 Other specified anxiety disorders: Secondary | ICD-10-CM | POA: Diagnosis not present

## 2016-04-14 NOTE — Progress Notes (Signed)
Pre visit review using our clinic review tool, if applicable. No additional management support is needed unless otherwise documented below in the visit note. 

## 2016-04-14 NOTE — Progress Notes (Signed)
   Subjective:    Patient ID: [facility], male    DOB: 01/27/1970, 46 y.o.   MRN: [id number]  HPI Here to follow up on depression. He has been struggling with this and has even had some audial and visual hallucinations. He had been taking Prozac 40 mg daily along with some occasional Xanax, but this was not helping so he stopped the Prozac about 4 days ago. He is accompanied by his wife today. She describes him as being tearful and depressed but also that he seems a little more anxious and hyperactive than usual. [name] admits that he feels jittery and finds it hard to sit still. His appetite is poor and he has poor sleep. Per my advice he has made an appt with a local Psychiatrist, and this is coming up in about 2 weeks. He is still unable to work.    Review of Systems  Constitutional: Negative.   Respiratory: Negative.   Cardiovascular: Negative.   Neurological: Negative.   Psychiatric/Behavioral: Positive for agitation, decreased concentration, dysphoric mood, hallucinations and sleep disturbance. Negative for confusion, self-injury and suicidal ideas. The patient is nervous/anxious. The patient is not hyperactive.        Objective:   Physical Exam  Constitutional: He is oriented to person, place, and time. He appears well-developed and well-nourished.  Cardiovascular: Normal rate, regular rhythm, normal heart sounds and intact distal pulses.   Pulmonary/Chest: Effort normal and breath sounds normal.  Neurological: He is alert and oriented to person, place, and time.  Psychiatric: His behavior is normal. Judgment and thought content normal.  He is a little anxious today. Good eye contact           Assessment & Plan:  Depression. We agreed to stay off Prozac for now. He will see Psychiatry soon as above. I encouraged him to use the Xanax as needed. We spent 40 minutes today discussing these issues.  Alysia Penna, MD

## 2016-04-14 NOTE — Patient Instructions (Signed)
WE NOW OFFER   [facility] FAST TRACK!!!  SAME DAY Appointments for ACUTE CARE  Such as: Sprains, Injuries, cuts, abrasions, rashes, muscle pain, joint pain, back pain Colds, flu, sore throats, headache, allergies, cough, fever  Ear pain, sinus and eye infections Abdominal pain, nausea, vomiting, diarrhea, upset stomach Animal/insect bites  3 Easy Ways to Schedule: Walk-In Scheduling Call in scheduling Mychart Sign-up: [email or web address]         

## 2016-04-21 ENCOUNTER — Ambulatory Visit (INDEPENDENT_AMBULATORY_CARE_PROVIDER_SITE_OTHER): Payer: 59 | Admitting: Psychology

## 2016-04-21 DIAGNOSIS — F411 Generalized anxiety disorder: Secondary | ICD-10-CM

## 2016-04-21 DIAGNOSIS — F331 Major depressive disorder, recurrent, moderate: Secondary | ICD-10-CM

## 2016-04-29 ENCOUNTER — Ambulatory Visit: Payer: 59 | Admitting: Psychology

## 2016-05-08 ENCOUNTER — Ambulatory Visit (HOSPITAL_COMMUNITY): Payer: 59 | Admitting: Psychiatry

## 2016-05-14 ENCOUNTER — Other Ambulatory Visit: Payer: Self-pay | Admitting: Family Medicine

## 2016-06-18 ENCOUNTER — Telehealth: Payer: Self-pay | Admitting: Family Medicine

## 2016-06-18 NOTE — Telephone Encounter (Signed)
Pt need new Rx for Oxycodone   Pt is aware of 3 business days for refills and someone will call when ready for pick up. °

## 2016-06-19 MED ORDER — OXYCODONE-ACETAMINOPHEN 10-325 MG PO TABS: 1.0000 | ORAL_TABLET | Freq: Four times a day (QID) | ORAL | 0 refills | Status: DC | PRN

## 2016-06-19 NOTE — Telephone Encounter (Signed)
done

## 2016-06-19 NOTE — Telephone Encounter (Signed)
Script is ready for pick up here at front office and I spoke with pt.  

## 2016-08-01 ENCOUNTER — Encounter: Payer: Self-pay | Admitting: Family Medicine

## 2016-08-01 ENCOUNTER — Ambulatory Visit (INDEPENDENT_AMBULATORY_CARE_PROVIDER_SITE_OTHER): Payer: 59 | Admitting: Family Medicine

## 2016-08-01 VITALS — BP 109/77 | HR 81 | Temp 98.6°F | Ht 64.5 in | Wt 155.0 lb

## 2016-08-01 DIAGNOSIS — K529 Noninfective gastroenteritis and colitis, unspecified: Secondary | ICD-10-CM

## 2016-08-01 MED ORDER — CIPROFLOXACIN HCL 500 MG PO TABS: 500.0000 mg | ORAL_TABLET | Freq: Two times a day (BID) | ORAL | 0 refills | Status: DC

## 2016-08-01 MED ORDER — PROMETHAZINE HCL 25 MG PO TABS: 25.0000 mg | ORAL_TABLET | ORAL | 0 refills | Status: DC | PRN

## 2016-08-01 MED ORDER — OXYCODONE-ACETAMINOPHEN 10-325 MG PO TABS: 1.0000 | ORAL_TABLET | Freq: Four times a day (QID) | ORAL | 0 refills | Status: DC | PRN

## 2016-08-01 NOTE — Patient Instructions (Signed)
WE NOW OFFER   [facility] FAST TRACK!!!  SAME DAY Appointments for ACUTE CARE  Such as: Sprains, Injuries, cuts, abrasions, rashes, muscle pain, joint pain, back pain Colds, flu, sore throats, headache, allergies, cough, fever  Ear pain, sinus and eye infections Abdominal pain, nausea, vomiting, diarrhea, upset stomach Animal/insect bites  3 Easy Ways to Schedule: Walk-In Scheduling Call in scheduling Mychart Sign-up: [email or web address]         

## 2016-08-01 NOTE — Progress Notes (Signed)
   Subjective:    Patient ID: [facility], male    DOB: 1970/12/08, 46 y.o.   MRN: [id number]  HPI Here for 3 days of abdominal cramps, diarrhea, nausea, and vomiting. No fever. This started soon after he ate a meal of steak and scrambled eggs at a [employer]. He has been sipping ginger ale but cannot keep anything else down.    Review of Systems  Constitutional: Negative.   Respiratory: Negative.   Cardiovascular: Negative.   Gastrointestinal: Positive for abdominal pain, diarrhea, nausea and vomiting. Negative for abdominal distention, anal bleeding, blood in stool and constipation.       Objective:   Physical Exam  Constitutional: He is oriented to person, place, and time. He appears well-developed and well-nourished. No distress.  He is mildly dehydrated   Cardiovascular: Normal rate, regular rhythm, normal heart sounds and intact distal pulses.   Pulmonary/Chest: Effort normal and breath sounds normal.  Abdominal: Soft. Bowel sounds are normal. He exhibits no distension and no mass. There is no rebound and no guarding.  Mild diffuse tenderness  Neurological: He is alert and oriented to person, place, and time.          Assessment & Plan:  Enteritis, possibly due to Salmonella. Treat with Cipro. Use Phenergan prn. Written out of work 07-30-16 until 08-04-16.  Alysia Penna, MD

## 2016-08-18 ENCOUNTER — Encounter: Payer: Self-pay | Admitting: Family Medicine

## 2016-08-18 ENCOUNTER — Ambulatory Visit (INDEPENDENT_AMBULATORY_CARE_PROVIDER_SITE_OTHER): Payer: 59 | Admitting: Family Medicine

## 2016-08-18 VITALS — BP 130/88 | HR 88 | Temp 98.6°F | Ht 64.5 in | Wt 162.0 lb

## 2016-08-18 DIAGNOSIS — M5440 Lumbago with sciatica, unspecified side: Secondary | ICD-10-CM | POA: Diagnosis not present

## 2016-08-18 DIAGNOSIS — K529 Noninfective gastroenteritis and colitis, unspecified: Secondary | ICD-10-CM

## 2016-08-18 MED ORDER — OXYCODONE-ACETAMINOPHEN 10-325 MG PO TABS: 1.0000 | ORAL_TABLET | Freq: Four times a day (QID) | ORAL | 0 refills | Status: DC | PRN

## 2016-08-18 NOTE — Progress Notes (Signed)
   Subjective:    Patient ID: Dagoberto Ligas, male    DOB: 1970/02/25, 46 y.o.   MRN: [id number]  HPI Here to follow up on enteritis. He was seen on 08-01-16 and was treated with Cipro. His symptoms have totally resolved and he feels fine now. He went back to work on 08-04-16. His back pain is stable.    Review of Systems  Constitutional: Negative.   Respiratory: Negative.   Cardiovascular: Negative.   Gastrointestinal: Negative.   Musculoskeletal: Positive for back pain.       Objective:   Physical Exam  Constitutional: He is oriented to person, place, and time. He appears well-developed and well-nourished.  Neck: No thyromegaly present.  Cardiovascular: Normal rate, regular rhythm, normal heart sounds and intact distal pulses.   Pulmonary/Chest: Effort normal and breath sounds normal. No respiratory distress. He has no wheezes. He has no rales.  Abdominal: Soft. Bowel sounds are normal. He exhibits no distension and no mass. There is no tenderness. There is no rebound and no guarding.  Lymphadenopathy:    He has no cervical adenopathy.  Neurological: He is alert and oriented to person, place, and time.          Assessment & Plan:  His enteritis has resolved. His back pain is stable.  Alysia Penna, MD

## 2016-08-18 NOTE — Patient Instructions (Signed)
WE NOW OFFER   [facility] FAST TRACK!!!  SAME DAY Appointments for ACUTE CARE  Such as: Sprains, Injuries, cuts, abrasions, rashes, muscle pain, joint pain, back pain Colds, flu, sore throats, headache, allergies, cough, fever  Ear pain, sinus and eye infections Abdominal pain, nausea, vomiting, diarrhea, upset stomach Animal/insect bites  3 Easy Ways to Schedule: Walk-In Scheduling Call in scheduling Mychart Sign-up: [email or web address]         

## 2016-09-29 ENCOUNTER — Telehealth: Payer: Self-pay | Admitting: Family Medicine

## 2016-09-29 ENCOUNTER — Other Ambulatory Visit: Payer: Self-pay | Admitting: Family Medicine

## 2016-09-29 MED ORDER — OXYCODONE-ACETAMINOPHEN 10-325 MG PO TABS: 1.0000 | ORAL_TABLET | Freq: Four times a day (QID) | ORAL | 0 refills | Status: DC | PRN

## 2016-09-29 NOTE — Telephone Encounter (Signed)
Refill both for 6 months. 

## 2016-09-29 NOTE — Telephone Encounter (Signed)
Script is ready for pick up here at front office and I spoke with pt.  

## 2016-09-29 NOTE — Telephone Encounter (Signed)
Pt need new Rx for Oxycodone   Pt is aware of 3 business days for refills and someone will call when ready for pick up. °

## 2016-09-29 NOTE — Telephone Encounter (Signed)
Done

## 2016-10-02 ENCOUNTER — Encounter: Payer: Self-pay | Admitting: Family Medicine

## 2016-10-06 ENCOUNTER — Encounter: Payer: Self-pay | Admitting: Family Medicine

## 2016-10-29 ENCOUNTER — Telehealth: Payer: Self-pay | Admitting: Family Medicine

## 2016-10-29 MED ORDER — OXYCODONE-ACETAMINOPHEN 10-325 MG PO TABS: 1.0000 | ORAL_TABLET | Freq: Four times a day (QID) | ORAL | 0 refills | Status: DC | PRN

## 2016-10-29 NOTE — Telephone Encounter (Signed)
Pt request refill  oxyCODONE-acetaminophen (PERCOCET) 10-325 MG tablet

## 2016-10-29 NOTE — Telephone Encounter (Signed)
Done

## 2016-10-29 NOTE — Telephone Encounter (Signed)
Script is ready for pick up here at front office and I spoke with pt, also letter was given.

## 2016-12-03 ENCOUNTER — Ambulatory Visit: Payer: 59 | Admitting: Family Medicine

## 2016-12-03 ENCOUNTER — Encounter: Payer: Self-pay | Admitting: Family Medicine

## 2016-12-03 VITALS — BP 118/80 | HR 93 | Temp 98.6°F | Wt 163.2 lb

## 2016-12-03 DIAGNOSIS — J018 Other acute sinusitis: Secondary | ICD-10-CM | POA: Diagnosis not present

## 2016-12-03 MED ORDER — OXYCODONE-ACETAMINOPHEN 10-325 MG PO TABS: 1.0000 | ORAL_TABLET | Freq: Four times a day (QID) | ORAL | 0 refills | Status: DC | PRN

## 2016-12-03 MED ORDER — AZITHROMYCIN 250 MG PO TABS: ORAL_TABLET | ORAL | 0 refills | Status: DC

## 2016-12-03 NOTE — Progress Notes (Signed)
   Subjective:    Patient ID: Dagoberto Ligas, male    DOB: Jun 28, 1970, 46 y.o.   MRN: [id number]  HPI Here for one week of sinus pressure, PND, itchy eyes, and blowing green mucus from the nose. No coughing.    Review of Systems  Constitutional: Negative.   HENT: Positive for congestion, postnasal drip, sinus pressure, sinus pain and sore throat. Negative for ear pain.   Eyes: Negative.   Respiratory: Negative.        Objective:   Physical Exam  Constitutional: He appears well-developed and well-nourished.  HENT:  Right Ear: External ear normal.  Left Ear: External ear normal.  Nose: Nose normal.  Mouth/Throat: Oropharynx is clear and moist.  Eyes: Conjunctivae are normal.  Neck: No thyromegaly present.  Pulmonary/Chest: Effort normal and breath sounds normal. No respiratory distress. He has no wheezes. He has no rales.  Lymphadenopathy:    He has no cervical adenopathy.          Assessment & Plan:  Sinusitis, treat with a Zpack. He can also use Zyrtec, Flonase, and Mucinex for symptom relief.  Alysia Penna, MD

## 2016-12-31 ENCOUNTER — Telehealth: Payer: Self-pay | Admitting: Family Medicine

## 2016-12-31 NOTE — Telephone Encounter (Signed)
Sent to PCP ?

## 2016-12-31 NOTE — Telephone Encounter (Signed)
Copied from [place] [id number]. Topic: General - Other >> Dec 31, 2016 10:04 AM Neva Seat wrote:  Pt is needing a written Rx on: Oxycodone 10.325

## 2016-12-31 NOTE — Telephone Encounter (Signed)
Controlled substance 

## 2017-01-01 MED ORDER — OXYCODONE-ACETAMINOPHEN 10-325 MG PO TABS: 1.0000 | ORAL_TABLET | Freq: Four times a day (QID) | ORAL | 0 refills | Status: DC | PRN

## 2017-01-01 NOTE — Telephone Encounter (Signed)
Pt notified of instructions and verbalized understanding. Pain mgmt visit schedule in January.

## 2017-01-01 NOTE — Addendum Note (Signed)
Addended by: Alysia Penna A on: 01/01/2017 11:43 AM   Modules accepted: Orders

## 2017-01-01 NOTE — Telephone Encounter (Signed)
This was emailed in. He will need a PMV for any more

## 2017-01-28 ENCOUNTER — Ambulatory Visit: Payer: 59 | Admitting: Family Medicine

## 2017-01-28 ENCOUNTER — Encounter: Payer: Self-pay | Admitting: Family Medicine

## 2017-01-28 VITALS — BP 116/78 | HR 87 | Temp 98.5°F | Wt 166.6 lb

## 2017-01-28 DIAGNOSIS — F1190 Opioid use, unspecified, uncomplicated: Secondary | ICD-10-CM | POA: Diagnosis not present

## 2017-01-28 DIAGNOSIS — M5440 Lumbago with sciatica, unspecified side: Secondary | ICD-10-CM | POA: Diagnosis not present

## 2017-01-28 DIAGNOSIS — G8929 Other chronic pain: Secondary | ICD-10-CM

## 2017-01-28 DIAGNOSIS — B001 Herpesviral vesicular dermatitis: Secondary | ICD-10-CM | POA: Diagnosis not present

## 2017-01-28 MED ORDER — OXYCODONE-ACETAMINOPHEN 10-325 MG PO TABS: 1.0000 | ORAL_TABLET | Freq: Four times a day (QID) | ORAL | 0 refills | Status: DC | PRN

## 2017-01-28 MED ORDER — VALACYCLOVIR HCL 500 MG PO TABS: 500.0000 mg | ORAL_TABLET | Freq: Two times a day (BID) | ORAL | 5 refills | Status: DC | PRN

## 2017-01-28 NOTE — Progress Notes (Signed)
   Subjective:    Patient ID: Dagoberto Ligas, male    DOB: Jun 06, 1970, 47 y.o.   MRN: [id number]  HPI Here for pain management. His back pain is well controlled. Indication for chronic opioid: low back pain Medication and dose: Percocet 10-325  # pills per month: 120 Last UDS date: 01-28-17 Pain contract signed (Y/N): 01-28-17 Date narcotic database last reviewed (include red flags): 01-28-17     Review of Systems  Constitutional: Negative.   Respiratory: Negative.   Cardiovascular: Negative.   Musculoskeletal: Positive for back pain.  Neurological: Negative.        Objective:   Physical Exam  Constitutional: He is oriented to person, place, and time. He appears well-developed and well-nourished.  Cardiovascular: Normal rate, regular rhythm, normal heart sounds and intact distal pulses.  Pulmonary/Chest: Breath sounds normal. No respiratory distress. He has no wheezes. He has no rales.  Neurological: He is alert and oriented to person, place, and time.          Assessment & Plan:  His back pain is well controlled. Meds refilled. Alysia Penna, MD

## 2017-02-02 LAB — PAIN MGMT, PROFILE 8 W/CONF, U
6 Acetylmorphine: NEGATIVE ng/mL (ref <10)
Alcohol Metabolites: NEGATIVE ng/mL (ref <500)
Alphahydroxyalprazolam: 801 ng/mL — ABNORMAL HIGH (ref <25)
Alphahydroxymidazolam: NEGATIVE ng/mL (ref <50)
Alphahydroxytriazolam: NEGATIVE ng/mL (ref <50)
Aminoclonazepam: NEGATIVE ng/mL (ref <25)
Amphetamines: NEGATIVE ng/mL (ref <500)
Benzodiazepines: POSITIVE ng/mL — AB (ref <100)
Buprenorphine, Urine: NEGATIVE ng/mL (ref <5)
Cocaine Metabolite: NEGATIVE ng/mL (ref <150)
Codeine: NEGATIVE ng/mL (ref <50)
Creatinine: 119.9 mg/dL
Hydrocodone: NEGATIVE ng/mL (ref <50)
Hydromorphone: NEGATIVE ng/mL (ref <50)
Hydroxyethylflurazepam: NEGATIVE ng/mL (ref <50)
Lorazepam: NEGATIVE ng/mL (ref <50)
MDMA: NEGATIVE ng/mL (ref <500)
Marijuana Metabolite: NEGATIVE ng/mL (ref <20)
Morphine: NEGATIVE ng/mL (ref <50)
Nordiazepam: NEGATIVE ng/mL (ref <50)
Norhydrocodone: NEGATIVE ng/mL (ref <50)
Noroxycodone: 3067 ng/mL — ABNORMAL HIGH (ref <50)
Opiates: NEGATIVE ng/mL (ref <100)
Oxazepam: NEGATIVE ng/mL (ref <50)
Oxidant: NEGATIVE ug/mL (ref <200)
Oxycodone: 864 ng/mL — ABNORMAL HIGH (ref <50)
Oxycodone: POSITIVE ng/mL — AB (ref <100)
Oxymorphone: 702 ng/mL — ABNORMAL HIGH (ref <50)
Temazepam: NEGATIVE ng/mL (ref <50)
pH: 6.75 (ref 4.5–9.0)

## 2017-03-04 ENCOUNTER — Telehealth: Payer: Self-pay | Admitting: Family Medicine

## 2017-03-04 NOTE — Telephone Encounter (Signed)
[facility] called to confirm possible drug interaction due to sedation. Patient taking Xanax, Ambien, and Percocet, must get approval and would like to dispense Narcan along with medications.

## 2017-03-04 NOTE — Telephone Encounter (Signed)
Sent to PCP to advise 

## 2017-03-05 ENCOUNTER — Other Ambulatory Visit: Payer: Self-pay | Admitting: Family Medicine

## 2017-03-05 NOTE — Telephone Encounter (Signed)
Pt called to see the status of the refill, contact pt when process is resolved

## 2017-03-05 NOTE — Telephone Encounter (Signed)
Sent to PCP TO ADVISE  

## 2017-03-06 NOTE — Telephone Encounter (Signed)
These prescriptions are all correct, no need for a Narcan rx

## 2017-03-06 NOTE — Telephone Encounter (Signed)
Called pharmacy they will open at 9 AM will call back again

## 2017-03-06 NOTE — Telephone Encounter (Signed)
Last OV   Last refilled  

## 2017-03-06 NOTE — Telephone Encounter (Signed)
Called pharmacy and left a VM with pt's name and DOB with the message to fill Rx's as directed and no need for Narcan is needed.

## 2017-03-10 ENCOUNTER — Encounter: Payer: Self-pay | Admitting: Family Medicine

## 2017-03-10 ENCOUNTER — Ambulatory Visit (INDEPENDENT_AMBULATORY_CARE_PROVIDER_SITE_OTHER): Payer: 59 | Admitting: Family Medicine

## 2017-03-10 VITALS — BP 102/74 | HR 87 | Temp 98.6°F | Ht 65.0 in | Wt 158.6 lb

## 2017-03-10 DIAGNOSIS — R6889 Other general symptoms and signs: Secondary | ICD-10-CM | POA: Diagnosis not present

## 2017-03-10 DIAGNOSIS — J018 Other acute sinusitis: Secondary | ICD-10-CM

## 2017-03-10 LAB — POC INFLUENZA A&B (BINAX/QUICKVUE)
Influenza A, POC: NEGATIVE
Influenza B, POC: NEGATIVE

## 2017-03-10 MED ORDER — ALPRAZOLAM 1 MG PO TABS: 1.0000 mg | ORAL_TABLET | Freq: Three times a day (TID) | ORAL | 5 refills | Status: DC | PRN

## 2017-03-10 MED ORDER — OXYCODONE-ACETAMINOPHEN 10-325 MG PO TABS: 1.0000 | ORAL_TABLET | Freq: Four times a day (QID) | ORAL | 0 refills | Status: DC | PRN

## 2017-03-10 MED ORDER — AMOXICILLIN-POT CLAVULANATE 875-125 MG PO TABS: 1.0000 | ORAL_TABLET | Freq: Two times a day (BID) | ORAL | 0 refills | Status: DC

## 2017-03-10 MED ORDER — ZOLPIDEM TARTRATE 10 MG PO TABS: 10.0000 mg | ORAL_TABLET | Freq: Every evening | ORAL | 5 refills | Status: DC | PRN

## 2017-03-10 NOTE — Progress Notes (Signed)
   Subjective:    Patient ID: James Bradley, male    DOB: 07-May-1970, 47 y.o.   MRN: [id number]  HPI Here for several issues. First he has been sick for over a week with sinus pressure, PND, and coughing up green sputum. No fever. Using Robitussin. Also he has been dealing with more anxiety lately. He is not sure why. We had originally planned to [facility] a wellness exam today but we decided to postpone this.    Review of Systems  Constitutional: Negative.   HENT: Positive for congestion, postnasal drip, sinus pressure and sinus pain. Negative for sore throat.   Eyes: Negative.   Respiratory: Positive for cough.   Psychiatric/Behavioral: Negative for dysphoric mood. The patient is nervous/anxious.        Objective:   Physical Exam  Constitutional: He is oriented to person, place, and time. He appears well-developed and well-nourished.  HENT:  Right Ear: External ear normal.  Left Ear: External ear normal.  Nose: Nose normal.  Mouth/Throat: Oropharynx is clear and moist.  Eyes: Conjunctivae are normal.  Neck: Neck supple. No thyromegaly present.  Pulmonary/Chest: Effort normal and breath sounds normal. No respiratory distress. He has no wheezes. He has no rales.  Lymphadenopathy:    He has no cervical adenopathy.  Neurological: He is alert and oriented to person, place, and time.  Psychiatric: He has a normal mood and affect. His behavior is normal. Thought content normal.          Assessment & Plan:  He has a sinusitis, and we will treat with Augmentin. For the anxiety we will refill the Xanax. He will reschedule the well exam.  Alysia Penna, MD

## 2017-03-17 ENCOUNTER — Ambulatory Visit: Payer: 59 | Admitting: Family Medicine

## 2017-03-17 ENCOUNTER — Encounter: Payer: Self-pay | Admitting: Family Medicine

## 2017-03-17 VITALS — BP 130/90 | HR 101 | Temp 98.3°F | Wt 164.4 lb

## 2017-03-17 DIAGNOSIS — J018 Other acute sinusitis: Secondary | ICD-10-CM | POA: Diagnosis not present

## 2017-03-17 NOTE — Progress Notes (Signed)
   Subjective:    Patient ID: James Bradley, male    DOB: January 02, 1971, 47 y.o.   MRN: [id number]  HPI Here to follow up on a sinusitis. We saw him on 03-10-17 and gave him Augmentin. This has helped and he is recovering well. He still has some sinus congestion but no fever or cough.    Review of Systems  Constitutional: Negative.   HENT: Positive for congestion and sinus pressure. Negative for postnasal drip, sinus pain and sore throat.   Eyes: Negative.   Respiratory: Negative.        Objective:   Physical Exam  Constitutional: He appears well-developed and well-nourished.  HENT:  Right Ear: External ear normal.  Left Ear: External ear normal.  Nose: Nose normal.  Mouth/Throat: Oropharynx is clear and moist.  Eyes: Conjunctivae are normal.  Neck: No thyromegaly present.  Pulmonary/Chest: Effort normal and breath sounds normal. No respiratory distress. He has no wheezes. He has no rales.  Lymphadenopathy:    He has no cervical adenopathy.          Assessment & Plan:  Sinusitis, almost resolved. We wrote him out of work from 03-09-17 until  03-19-17.  Alysia Penna, MD

## 2017-03-20 ENCOUNTER — Encounter: Payer: Self-pay | Admitting: Family Medicine

## 2017-03-20 ENCOUNTER — Ambulatory Visit (INDEPENDENT_AMBULATORY_CARE_PROVIDER_SITE_OTHER): Payer: 59 | Admitting: Family Medicine

## 2017-03-20 VITALS — BP 124/80 | HR 88 | Temp 98.0°F | Wt 165.0 lb

## 2017-03-20 DIAGNOSIS — Z0000 Encounter for general adult medical examination without abnormal findings: Secondary | ICD-10-CM | POA: Diagnosis not present

## 2017-03-20 LAB — POC URINALSYSI DIPSTICK (AUTOMATED)
Bilirubin, UA: NEGATIVE
Blood, UA: NEGATIVE
Glucose, UA: NEGATIVE
Ketones, UA: NEGATIVE
Leukocytes, UA: NEGATIVE
Nitrite, UA: NEGATIVE
Protein, UA: NEGATIVE
Spec Grav, UA: >=1.03 — AB (ref 1.010–1.025)
Urobilinogen, UA: 0.2 E.U./dL
pH, UA: 6 (ref 5.0–8.0)

## 2017-03-20 NOTE — Progress Notes (Signed)
   Subjective:    Patient ID: James Bradley, male    DOB: 1970-11-14, 47 y.o.   MRN: [id number]  HPI Here for a well exam. He is doing well. He has recovered from a recent sinus infection.    Review of Systems  Constitutional: Negative.   HENT: Negative.   Eyes: Negative.   Respiratory: Negative.   Cardiovascular: Negative.   Gastrointestinal: Negative.   Genitourinary: Negative.   Musculoskeletal: Negative.   Skin: Negative.   Neurological: Negative.   Psychiatric/Behavioral: Negative.        Objective:   Physical Exam  Constitutional: He is oriented to person, place, and time. He appears well-developed and well-nourished. No distress.  HENT:  Head: Normocephalic and atraumatic.  Right Ear: External ear normal.  Left Ear: External ear normal.  Nose: Nose normal.  Mouth/Throat: Oropharynx is clear and moist. No oropharyngeal exudate.  Eyes: Conjunctivae and EOM are normal. Pupils are equal, round, and reactive to light. Right eye exhibits no discharge. Left eye exhibits no discharge. No scleral icterus.  Neck: Neck supple. No JVD present. No tracheal deviation present. No thyromegaly present.  Cardiovascular: Normal rate, regular rhythm, normal heart sounds and intact distal pulses. Exam reveals no gallop and no friction rub.  No murmur heard. Pulmonary/Chest: Effort normal and breath sounds normal. No respiratory distress. He has no wheezes. He has no rales. He exhibits no tenderness.  Abdominal: Soft. Bowel sounds are normal. He exhibits no distension and no mass. There is no tenderness. There is no rebound and no guarding.  Genitourinary: Rectum normal and penis normal. Rectal exam shows guaiac negative stool. No penile tenderness.  Genitourinary Comments: Prostate is surgically absent   Musculoskeletal: Normal range of motion. He exhibits no edema or tenderness.  Lymphadenopathy:    He has no cervical adenopathy.  Neurological: He is alert and oriented to person,  place, and time. He has normal reflexes. No cranial nerve deficit. He exhibits normal muscle tone. Coordination normal.  Skin: Skin is warm and dry. No rash noted. He is not diaphoretic. No erythema. No pallor.  Psychiatric: He has a normal mood and affect. His behavior is normal. Judgment and thought content normal.          Assessment & Plan:  Well exam. We discussed diet and exercise. Get fasting labs.  Alysia Penna, MD

## 2017-03-21 LAB — BASIC METABOLIC PANEL
BUN: 14 mg/dL (ref 7–25)
CO2: 27 mmol/L (ref 20–32)
Calcium: 10.4 mg/dL — ABNORMAL HIGH (ref 8.6–10.3)
Chloride: 99 mmol/L (ref 98–110)
Creat: 0.9 mg/dL (ref 0.60–1.35)
Glucose, Bld: 91 mg/dL (ref 65–99)
Potassium: 4.7 mmol/L (ref 3.5–5.3)
Sodium: 138 mmol/L (ref 135–146)

## 2017-03-21 LAB — CBC WITH DIFFERENTIAL/PLATELET
Basophils Absolute: 61 cells/uL (ref 0–200)
Basophils Relative: 1.1 %
Eosinophils Absolute: 61 cells/uL (ref 15–500)
Eosinophils Relative: 1.1 %
HCT: 40.9 % (ref 38.5–50.0)
Hemoglobin: 13.1 g/dL — ABNORMAL LOW (ref 13.2–17.1)
Lymphs Abs: 2844 cells/uL (ref 850–3900)
MCH: 22.3 pg — ABNORMAL LOW (ref 27.0–33.0)
MCHC: 32 g/dL (ref 32.0–36.0)
MCV: 69.6 fL — ABNORMAL LOW (ref 80.0–100.0)
MPV: 10.3 fL (ref 7.5–12.5)
Monocytes Relative: 8.4 %
Neutro Abs: 2074 cells/uL (ref 1500–7800)
Neutrophils Relative %: 37.7 %
Platelets: 358 10*3/uL (ref 140–400)
RBC: 5.88 10*6/uL — ABNORMAL HIGH (ref 4.20–5.80)
RDW: 17.7 % — ABNORMAL HIGH (ref 11.0–15.0)
Total Lymphocyte: 51.7 %
WBC mixed population: 462 cells/uL (ref 200–950)
WBC: 5.5 10*3/uL (ref 3.8–10.8)

## 2017-03-21 LAB — HEPATIC FUNCTION PANEL
AG Ratio: 2.2 (calc) (ref 1.0–2.5)
ALT: 47 U/L — ABNORMAL HIGH (ref 9–46)
AST: 36 U/L (ref 10–40)
Albumin: 5.3 g/dL — ABNORMAL HIGH (ref 3.6–5.1)
Alkaline phosphatase (APISO): 46 U/L (ref 40–115)
Bilirubin, Direct: 0.1 mg/dL (ref 0.0–0.2)
Globulin: 2.4 g/dL (calc) (ref 1.9–3.7)
Indirect Bilirubin: 0.7 mg/dL (calc) (ref 0.2–1.2)
Total Bilirubin: 0.8 mg/dL (ref 0.2–1.2)
Total Protein: 7.7 g/dL (ref 6.1–8.1)

## 2017-03-21 LAB — LIPID PANEL
Cholesterol: 193 mg/dL (ref <200)
HDL: 84 mg/dL (ref >40)
LDL Cholesterol (Calc): 86 mg/dL (calc)
Non-HDL Cholesterol (Calc): 109 mg/dL (calc) (ref <130)
Total CHOL/HDL Ratio: 2.3 (calc) (ref <5.0)
Triglycerides: 125 mg/dL (ref <150)

## 2017-03-21 LAB — PSA: PSA: <0.1 ng/mL (ref <=4.0)

## 2017-03-21 LAB — TSH: TSH: 0.54 mIU/L (ref 0.40–4.50)

## 2017-04-02 ENCOUNTER — Other Ambulatory Visit: Payer: Self-pay | Admitting: Family Medicine

## 2017-04-03 MED ORDER — OXYCODONE-ACETAMINOPHEN 10-325 MG PO TABS: 1.0000 | ORAL_TABLET | Freq: Four times a day (QID) | ORAL | 0 refills | Status: DC | PRN

## 2017-04-03 NOTE — Telephone Encounter (Signed)
I sent in a 30 day supply. After that he will be due for another PMV in April

## 2017-04-03 NOTE — Telephone Encounter (Signed)
Sent to PCP for approval.  

## 2017-04-03 NOTE — Telephone Encounter (Signed)
Last OV 03/20/2017  Last refilled 03/10/2017 disp 120 with no refills

## 2017-04-06 DIAGNOSIS — Z0100 Encounter for examination of eyes and vision without abnormal findings: Secondary | ICD-10-CM | POA: Diagnosis not present

## 2017-04-07 ENCOUNTER — Other Ambulatory Visit: Payer: Self-pay | Admitting: Family Medicine

## 2017-04-08 NOTE — Telephone Encounter (Signed)
Called the pharmacy and left a VM for them to call back.   Rx was refilled 04/03/2017

## 2017-04-09 NOTE — Telephone Encounter (Signed)
Spoke with patient, informed him that Rx was sent for 30 days. Patient verbalized understanding. Patient is scheduled for PMV on 04/15/17.

## 2017-04-15 ENCOUNTER — Ambulatory Visit: Payer: 59 | Admitting: Family Medicine

## 2017-04-15 ENCOUNTER — Encounter: Payer: Self-pay | Admitting: Family Medicine

## 2017-04-15 VITALS — BP 118/80 | HR 73 | Temp 98.2°F | Ht 65.0 in | Wt 163.0 lb

## 2017-04-15 DIAGNOSIS — G8929 Other chronic pain: Secondary | ICD-10-CM | POA: Diagnosis not present

## 2017-04-15 DIAGNOSIS — F1190 Opioid use, unspecified, uncomplicated: Secondary | ICD-10-CM

## 2017-04-15 DIAGNOSIS — M5440 Lumbago with sciatica, unspecified side: Secondary | ICD-10-CM | POA: Diagnosis not present

## 2017-04-15 MED ORDER — OXYCODONE-ACETAMINOPHEN 10-325 MG PO TABS: 1.0000 | ORAL_TABLET | Freq: Four times a day (QID) | ORAL | 0 refills | Status: DC | PRN

## 2017-04-15 MED ORDER — TERBINAFINE HCL 250 MG PO TABS: 250.0000 mg | ORAL_TABLET | Freq: Every day | ORAL | 1 refills | Status: DC

## 2017-04-15 NOTE — Progress Notes (Signed)
   Subjective:    Patient ID: James Bradley, male    DOB: 09-06-1970, 47 y.o.   MRN: [id number]  HPI Here for pain management. He is doing well.  Indication for chronic opioid: low back pain  Medication and dose: Percocet 10-325  # pills per month: 120 Last UDS date: 01-28-17 Opioid Treatment Agreement signed (Y/N): 04-15-17 Opioid Treatment Agreement last reviewed with patient:  04-15-17 [place] reviewed this encounter (include red flags):  04-15-17     Review of Systems  Constitutional: Negative.   Respiratory: Negative.   Cardiovascular: Negative.   Musculoskeletal: Positive for back pain.  Neurological: Negative.        Objective:   Physical Exam  Constitutional: He is oriented to person, place, and time. He appears well-developed and well-nourished.  Cardiovascular: Normal rate, regular rhythm, normal heart sounds and intact distal pulses.  Pulmonary/Chest: Effort normal and breath sounds normal. No respiratory distress. He has no wheezes. He has no rales.  Neurological: He is alert and oriented to person, place, and time.          Assessment & Plan:  Pain management. Meds were refilled.  Alysia Penna, MD

## 2017-04-27 ENCOUNTER — Ambulatory Visit: Payer: 59 | Admitting: Family Medicine

## 2017-04-27 ENCOUNTER — Encounter: Payer: Self-pay | Admitting: Family Medicine

## 2017-04-27 VITALS — BP 120/70 | HR 80 | Temp 98.8°F | Ht 65.0 in | Wt 162.4 lb

## 2017-04-27 DIAGNOSIS — N3281 Overactive bladder: Secondary | ICD-10-CM | POA: Diagnosis not present

## 2017-04-27 MED ORDER — SOLIFENACIN SUCCINATE 5 MG PO TABS: 5.0000 mg | ORAL_TABLET | Freq: Every day | ORAL | 2 refills | Status: DC

## 2017-04-27 NOTE — Progress Notes (Signed)
   Subjective:    Patient ID: James Bradley, male    DOB: 29-Mar-1970, 47 y.o.   MRN: [id number]  HPI Here to discuss recent bladder problems. He is S/P robotic prostatectomy, and he had some urinary issues shortly after the surgery. These resolved and he did well until a few weeks ago. No he is experiencing occasional urgency to urinate such that he cannot make it to the bathroom in time and he urinates on himself. This may happen one day a week. There is no discomfort at all. He asks for intermittent FMLA forms to be filled out because he does not want this to happen while he is at work.    Review of Systems  Constitutional: Negative.   Respiratory: Negative.   Cardiovascular: Negative.   Genitourinary: Positive for urgency. Negative for difficulty urinating, dysuria, flank pain, frequency and hematuria.       Objective:   Physical Exam  Constitutional: He appears well-developed and well-nourished.  Cardiovascular: Normal rate, regular rhythm, normal heart sounds and intact distal pulses.  Pulmonary/Chest: Effort normal and breath sounds normal. No respiratory distress. He has no wheezes. He has no rales.  Abdominal: Soft. Bowel sounds are normal. He exhibits no distension. There is no tenderness. There is no rebound and no guarding.          Assessment & Plan:  OAB. He is not able to provide a urine sample today so I asked him to return ASAP to give a sample so we can rule out a UTI. He will try Vesicare 5 mg daily. We will fill out FMLA forms for him. Recheck in 2-3 weeks.  Alysia Penna, MD

## 2017-04-28 DIAGNOSIS — N3281 Overactive bladder: Secondary | ICD-10-CM | POA: Insufficient documentation

## 2017-07-30 ENCOUNTER — Other Ambulatory Visit: Payer: Self-pay | Admitting: Family Medicine

## 2017-07-30 NOTE — Telephone Encounter (Signed)
Last OV 04/27/2017   Last refilled 07/06/2017 disp 120 with no refills

## 2017-08-06 ENCOUNTER — Ambulatory Visit (INDEPENDENT_AMBULATORY_CARE_PROVIDER_SITE_OTHER): Payer: 59 | Admitting: Family Medicine

## 2017-08-06 ENCOUNTER — Encounter: Payer: Self-pay | Admitting: Family Medicine

## 2017-08-06 ENCOUNTER — Encounter

## 2017-08-06 VITALS — BP 123/83 | HR 95 | Temp 98.6°F | Wt 159.0 lb

## 2017-08-06 DIAGNOSIS — G8929 Other chronic pain: Secondary | ICD-10-CM

## 2017-08-06 DIAGNOSIS — F1190 Opioid use, unspecified, uncomplicated: Secondary | ICD-10-CM

## 2017-08-06 DIAGNOSIS — F418 Other specified anxiety disorders: Secondary | ICD-10-CM

## 2017-08-06 DIAGNOSIS — M5440 Lumbago with sciatica, unspecified side: Secondary | ICD-10-CM | POA: Diagnosis not present

## 2017-08-06 MED ORDER — OXYCODONE-ACETAMINOPHEN 10-325 MG PO TABS: 1.0000 | ORAL_TABLET | Freq: Four times a day (QID) | ORAL | 0 refills | Status: DC | PRN

## 2017-08-06 MED ORDER — OXYCODONE-ACETAMINOPHEN 10-325 MG PO TABS
1.0000 | ORAL_TABLET | Freq: Four times a day (QID) | ORAL | 0 refills | Status: AC | PRN
Start: 2017-10-07 — End: 2017-11-06

## 2017-08-06 MED ORDER — FLUOXETINE HCL 20 MG PO TABS: 20.0000 mg | ORAL_TABLET | Freq: Every day | ORAL | 3 refills | Status: DC

## 2017-08-06 NOTE — Progress Notes (Signed)
   Subjective:    Patient ID: James Bradley, male    DOB: 26-Sep-1970, 47 y.o.   MRN: [id number]  HPI Here for pain management. His back pain has been well controlled. He does mention struggling with anxiety and depression however. He worries about things and he has trouble relaxing. Sometimes he feels sad and finds it hard to get out of bed.  Indication for chronic opioid: low back pain Medication and dose: Percocet 10-325 # pills per month: 120 Last UDS date: 01-28-17 Opioid Treatment Agreement signed (Y/N): 04-15-17 Opioid Treatment Agreement last reviewed with patient:  08-06-17 [place] reviewed this encounter (include red flags):  08-06-17    Review of Systems  Constitutional: Negative.   Respiratory: Negative.   Cardiovascular: Negative.   Musculoskeletal: Positive for back pain.  Neurological: Negative.   Psychiatric/Behavioral: Positive for dysphoric mood. Negative for self-injury and suicidal ideas. The patient is nervous/anxious.        Objective:   Physical Exam  Constitutional: He is oriented to person, place, and time. He appears well-developed and well-nourished.  Cardiovascular: Normal rate, regular rhythm, normal heart sounds and intact distal pulses.  Pulmonary/Chest: Effort normal and breath sounds normal.  Neurological: He is alert and oriented to person, place, and time.  Psychiatric: He has a normal mood and affect. His behavior is normal. Thought content normal.          Assessment & Plan:  For pain management, his meds are refilled. 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  Alysia Penna, MD

## 2017-09-29 ENCOUNTER — Other Ambulatory Visit: Payer: Self-pay | Admitting: Family Medicine

## 2017-09-29 ENCOUNTER — Telehealth: Payer: Self-pay | Admitting: Family Medicine

## 2017-09-29 NOTE — Telephone Encounter (Signed)
Dr. Sarajane Jews, please advise of refills. Thanks

## 2017-09-29 NOTE — Telephone Encounter (Signed)
Copied from [place] [id number]. Topic: Quick Communication - See Telephone Encounter >> Sep 29, 2017  8:18 AM Conception Chancy, NT wrote: CRM for notification. See Telephone encounter for: 09/29/17.  Patient is calling and requesting a refill on zolpidem (AMBIEN) 10 MG tablet  and ALPRAZolam (XANAX) 1 MG tablet. Patient leaves out of town tomorrow and would like for them to be filled by then, patient is aware of medication refill turn around time.   [id number] [address], [place] [address] Phone: [phone] Fax: [phone]

## 2017-09-29 NOTE — Telephone Encounter (Signed)
Refill for:  Xanax 1 mg, prescription expired on 09/06/17.  LR 03/10/17, 90 tabs and 5 refills.  Ambien 10 mg. Prescription expired on 09/06/17.  LR 03/10/17, 30 tabs and 5 refills.  LOV  08/06/17 [id number] Dr. Sarajane Jews  Controlled medications.

## 2017-09-30 NOTE — Telephone Encounter (Signed)
Call in Alprazolam #90 with 5 rf, also Zolpidem #30 with 5 rf

## 2017-09-30 NOTE — Telephone Encounter (Signed)
Dr. Sarajane Jews please advise of the refills requested.  thanks

## 2017-10-01 MED ORDER — ZOLPIDEM TARTRATE 10 MG PO TABS: 10.0000 mg | ORAL_TABLET | Freq: Every evening | ORAL | 5 refills | Status: DC | PRN

## 2017-10-01 MED ORDER — ALPRAZOLAM 1 MG PO TABS: 1.0000 mg | ORAL_TABLET | Freq: Three times a day (TID) | ORAL | 5 refills | Status: DC | PRN

## 2017-10-01 NOTE — Telephone Encounter (Signed)
I have called and spoke with pt and he is aware that the 2 rx have been called to his pharmacy.

## 2017-10-02 NOTE — Telephone Encounter (Signed)
Done

## 2017-10-26 ENCOUNTER — Other Ambulatory Visit: Payer: Self-pay | Admitting: Family Medicine

## 2017-10-27 NOTE — Telephone Encounter (Signed)
Dr. Fry please advise on refill. Thanks  

## 2017-11-02 ENCOUNTER — Other Ambulatory Visit: Payer: Self-pay | Admitting: Family Medicine

## 2017-11-02 NOTE — Telephone Encounter (Signed)
Copied from [place] [id number]. Topic: Quick Communication - Rx Refill/Question >> Nov 02, 2017 11:44 AM Burchel, Abbi R wrote: Medication: oxyCODONE-acetaminophen (PERCOCET) 10-325 MG tablet  Preferred Pharmacy:  [id number] [address], [place] [address] Phone: [phone] Fax: [phone]   Pt was advised that RX refills may take up to 3 business days. We ask that you follow-up with your pharmacy.

## 2017-11-02 NOTE — Telephone Encounter (Signed)
Requested medication (s) are due for refill today: yes  Requested medication (s) are on the active medication list: yes  Last refill:  10/07/17 #120  Future visit scheduled: no  Notes to clinic:      Requested Prescriptions  Pending Prescriptions Disp Refills   oxyCODONE-acetaminophen (PERCOCET) 10-325 MG tablet 120 tablet 0    Sig: Take 1 tablet by mouth every 6 (six) hours as needed for pain.     Not Delegated - Analgesics:  Opioid Agonist Combinations Failed - 11/02/2017  2:12 PM      Failed - This refill cannot be delegated      Failed - Urine Drug Screen completed in last 360 days.      Passed - Valid encounter within last 6 months    Recent Outpatient Visits          2 months ago Chronic narcotic use   [place] [facility], Ishmael Holter, MD   6 months ago OAB (overactive bladder)   [facility] [place], [place]   6 months ago Chronic narcotic use   [facility] [place], [place]   7 months ago Preventative health care   [facility] [place], [place]   7 months ago Other acute sinusitis, recurrence not specified   [facility], Ishmael Holter, MD

## 2017-11-03 NOTE — Telephone Encounter (Signed)
NO, he needs a PMV

## 2017-11-09 NOTE — Telephone Encounter (Signed)
° °  Pt is aware he need an appt

## 2017-11-23 ENCOUNTER — Encounter: Payer: Self-pay | Admitting: Family Medicine

## 2017-11-23 ENCOUNTER — Ambulatory Visit (INDEPENDENT_AMBULATORY_CARE_PROVIDER_SITE_OTHER): Payer: 59 | Admitting: Family Medicine

## 2017-11-23 VITALS — BP 118/72 | HR 80 | Temp 98.5°F | Wt 158.1 lb

## 2017-11-23 DIAGNOSIS — F1190 Opioid use, unspecified, uncomplicated: Secondary | ICD-10-CM | POA: Diagnosis not present

## 2017-11-23 DIAGNOSIS — G8929 Other chronic pain: Secondary | ICD-10-CM | POA: Diagnosis not present

## 2017-11-23 DIAGNOSIS — M5440 Lumbago with sciatica, unspecified side: Secondary | ICD-10-CM | POA: Diagnosis not present

## 2017-11-23 MED ORDER — OXYCODONE-ACETAMINOPHEN 10-325 MG PO TABS: 1.0000 | ORAL_TABLET | Freq: Four times a day (QID) | ORAL | 0 refills | Status: DC | PRN

## 2017-11-23 NOTE — Progress Notes (Signed)
   Subjective:    Patient ID: James Bradley, male    DOB: 07-01-1970, 47 y.o.   MRN: [id number]  HPI Here for pain management. He is doing well.  Indication for chronic opioid: low back pain Medication and dose: Percocet 10-325 # pills per month: 120 Last UDS date: 01-28-17 Opioid Treatment Agreement signed (Y/N): 04-15-17 Opioid Treatment Agreement last reviewed with patient:  11-24-17 [facility] reviewed this encounter (include red flags):  11-24-17    Review of Systems  Constitutional: Negative.   Respiratory: Negative.   Cardiovascular: Negative.   Musculoskeletal: Positive for back pain.  Neurological: Negative.        Objective:   Physical Exam  Constitutional: He is oriented to person, place, and time. He appears well-developed and well-nourished.  Cardiovascular: Normal rate, regular rhythm, normal heart sounds and intact distal pulses.  Pulmonary/Chest: Effort normal and breath sounds normal.  Neurological: He is alert and oriented to person, place, and time.          Assessment & Plan:  Pain management, meds were refilled.  Alysia Penna, MD

## 2017-11-26 ENCOUNTER — Telehealth: Payer: Self-pay

## 2017-11-26 NOTE — Telephone Encounter (Signed)
Copied from [place] [phone]. Topic: General - Inquiry >> Nov 26, 2017  8:24 AM Margot Ables wrote: Reason for CRM: pt calling to f/u on FMLA paperwork. He said he was in Monday 11/11 and was told it would be sent off Tuesday 11/24/17. He checked with [employer]/his employer, and they have not been received yet. They will be late after Monday 11/30/17. Pt requesting f/u call. If not able to answer please leave detailed message.

## 2017-11-30 NOTE — Telephone Encounter (Signed)
Forms have been faxed back to the number on the forms.

## 2017-11-30 NOTE — Telephone Encounter (Signed)
Called and spoke with pt and he is aware of these forms that have been faxed.

## 2018-02-22 ENCOUNTER — Encounter: Payer: Self-pay | Admitting: Family Medicine

## 2018-02-22 ENCOUNTER — Ambulatory Visit: Payer: 59 | Admitting: Family Medicine

## 2018-02-22 VITALS — BP 114/70 | HR 83 | Temp 98.3°F | Wt 157.1 lb

## 2018-02-22 DIAGNOSIS — M5440 Lumbago with sciatica, unspecified side: Secondary | ICD-10-CM

## 2018-02-22 DIAGNOSIS — G8929 Other chronic pain: Secondary | ICD-10-CM | POA: Diagnosis not present

## 2018-02-22 DIAGNOSIS — F1190 Opioid use, unspecified, uncomplicated: Secondary | ICD-10-CM

## 2018-02-22 MED ORDER — OXYCODONE-ACETAMINOPHEN 10-325 MG PO TABS: 1.0000 | ORAL_TABLET | Freq: Four times a day (QID) | ORAL | 0 refills | Status: DC | PRN

## 2018-02-22 MED ORDER — OXYCODONE-ACETAMINOPHEN 10-325 MG PO TABS: 1.0000 | ORAL_TABLET | Freq: Four times a day (QID) | ORAL | 0 refills | Status: AC | PRN

## 2018-02-22 NOTE — Progress Notes (Signed)
   Subjective:    Patient ID: James Bradley, male    DOB: 01-Feb-1970, 48 y.o.   MRN: [id number]  HPI Here for pain management. He is doing well.  Indication for chronic opioid: low back pain Medication and dose: Percocet 10-325 # pills per month: 120 Last UDS date: 02-22-18 Opioid Treatment Agreement signed (Y/N): 04-15-17 Opioid Treatment Agreement last reviewed with patient: 02-22-18 [facility] reviewed this encounter (include red flags):  02-22-18    Review of Systems  Constitutional: Negative.   Respiratory: Negative.   Cardiovascular: Negative.   Musculoskeletal: Positive for back pain.  Neurological: Negative.        Objective:   Physical Exam Constitutional:      Appearance: Normal appearance.  Cardiovascular:     Rate and Rhythm: Normal rate and regular rhythm.     Pulses: Normal pulses.     Heart sounds: Normal heart sounds.  Pulmonary:     Effort: Pulmonary effort is normal.     Breath sounds: Normal breath sounds.  Neurological:     General: No focal deficit present.     Mental Status: He is alert and oriented to person, place, and time.           Assessment & Plan:  Pain management, meds were refilled.  Alysia Penna, MD

## 2018-02-27 LAB — PAIN MGMT, PROFILE 8 W/CONF, U
6 Acetylmorphine: NEGATIVE ng/mL (ref <10)
Alcohol Metabolites: POSITIVE ng/mL — AB (ref <500)
Alphahydroxyalprazolam: 1821 ng/mL — ABNORMAL HIGH (ref <25)
Alphahydroxymidazolam: NEGATIVE ng/mL (ref <50)
Alphahydroxytriazolam: NEGATIVE ng/mL (ref <50)
Aminoclonazepam: NEGATIVE ng/mL (ref <25)
Amphetamine: 12560 ng/mL — ABNORMAL HIGH (ref <250)
Amphetamines: POSITIVE ng/mL — AB (ref <500)
Benzodiazepines: POSITIVE ng/mL — AB (ref <100)
Buprenorphine, Urine: NEGATIVE ng/mL (ref <5)
Cocaine Metabolite: NEGATIVE ng/mL (ref <150)
Creatinine: 187.4 mg/dL
Ethyl Glucuronide (ETG): 1843 ng/mL — ABNORMAL HIGH (ref <500)
Ethyl Sulfate (ETS): 1807 ng/mL — ABNORMAL HIGH (ref <100)
Hydroxyethylflurazepam: NEGATIVE ng/mL (ref <50)
Lorazepam: NEGATIVE ng/mL (ref <50)
MDMA: NEGATIVE ng/mL (ref <500)
Marijuana Metabolite: NEGATIVE ng/mL (ref <20)
Methamphetamine: NEGATIVE ng/mL (ref <250)
Nordiazepam: NEGATIVE ng/mL (ref <50)
Opiates: NEGATIVE ng/mL (ref <100)
Oxazepam: NEGATIVE ng/mL (ref <50)
Oxidant: NEGATIVE ug/mL (ref <200)
Oxycodone: NEGATIVE ng/mL (ref <100)
Temazepam: NEGATIVE ng/mL (ref <50)
pH: 6 (ref 4.5–9.0)

## 2018-03-22 ENCOUNTER — Ambulatory Visit (INDEPENDENT_AMBULATORY_CARE_PROVIDER_SITE_OTHER): Payer: 59 | Admitting: Family Medicine

## 2018-03-22 ENCOUNTER — Encounter: Payer: Self-pay | Admitting: Family Medicine

## 2018-03-22 VITALS — BP 110/72 | HR 80 | Temp 98.4°F | Ht 65.0 in | Wt 158.4 lb

## 2018-03-22 DIAGNOSIS — Z0000 Encounter for general adult medical examination without abnormal findings: Secondary | ICD-10-CM | POA: Diagnosis not present

## 2018-03-22 LAB — CBC WITH DIFFERENTIAL/PLATELET
Basophils Absolute: 0 10*3/uL (ref 0.0–0.1)
Basophils Relative: 0.9 % (ref 0.0–3.0)
Eosinophils Absolute: 0.1 10*3/uL (ref 0.0–0.7)
Eosinophils Relative: 1.2 % (ref 0.0–5.0)
HCT: 39.8 % (ref 39.0–52.0)
Hemoglobin: 12.8 g/dL — ABNORMAL LOW (ref 13.0–17.0)
Lymphocytes Relative: 55.5 % — ABNORMAL HIGH (ref 12.0–46.0)
Lymphs Abs: 3 10*3/uL (ref 0.7–4.0)
MCHC: 32.2 g/dL (ref 30.0–36.0)
MCV: 73.2 fl — ABNORMAL LOW (ref 78.0–100.0)
Monocytes Absolute: 0.4 10*3/uL (ref 0.1–1.0)
Monocytes Relative: 7.4 % (ref 3.0–12.0)
Neutro Abs: 1.9 10*3/uL (ref 1.4–7.7)
Neutrophils Relative %: 35 % — ABNORMAL LOW (ref 43.0–77.0)
Platelets: 287 10*3/uL (ref 150.0–400.0)
RBC: 5.43 Mil/uL (ref 4.22–5.81)
RDW: 16.4 % — ABNORMAL HIGH (ref 11.5–15.5)
WBC: 5.4 10*3/uL (ref 4.0–10.5)

## 2018-03-22 LAB — HEPATIC FUNCTION PANEL
ALT: 22 U/L (ref 0–53)
AST: 24 U/L (ref 0–37)
Albumin: 4.8 g/dL (ref 3.5–5.2)
Alkaline Phosphatase: 52 U/L (ref 39–117)
Bilirubin, Direct: 0.2 mg/dL (ref 0.0–0.3)
Total Bilirubin: 0.8 mg/dL (ref 0.2–1.2)
Total Protein: 7.1 g/dL (ref 6.0–8.3)

## 2018-03-22 LAB — POC URINALSYSI DIPSTICK (AUTOMATED)
Bilirubin, UA: NEGATIVE
Blood, UA: NEGATIVE
Glucose, UA: NEGATIVE
Ketones, UA: NEGATIVE
Leukocytes, UA: NEGATIVE
Nitrite, UA: NEGATIVE
Protein, UA: NEGATIVE
Spec Grav, UA: 1.01 (ref 1.010–1.025)
Urobilinogen, UA: 0.2 E.U./dL
pH, UA: 6 (ref 5.0–8.0)

## 2018-03-22 LAB — BASIC METABOLIC PANEL
BUN: 10 mg/dL (ref 6–23)
CO2: 31 mEq/L (ref 19–32)
Calcium: 9.6 mg/dL (ref 8.4–10.5)
Chloride: 99 mEq/L (ref 96–112)
Creatinine, Ser: 0.91 mg/dL (ref 0.40–1.50)
GFR: 88.46 mL/min (ref >60.00)
Glucose, Bld: 82 mg/dL (ref 70–99)
Potassium: 4 mEq/L (ref 3.5–5.1)
Sodium: 140 mEq/L (ref 135–145)

## 2018-03-22 LAB — LIPID PANEL
Cholesterol: 159 mg/dL (ref 0–200)
HDL: 96.5 mg/dL (ref >39.00)
LDL Cholesterol: 38 mg/dL (ref 0–99)
NonHDL: 62.88
Total CHOL/HDL Ratio: 2
Triglycerides: 126 mg/dL (ref 0.0–149.0)
VLDL: 25.2 mg/dL (ref 0.0–40.0)

## 2018-03-22 LAB — PSA: PSA: 0.03 ng/mL — ABNORMAL LOW (ref 0.10–4.00)

## 2018-03-22 LAB — TSH: TSH: 2.01 u[IU]/mL (ref 0.35–4.50)

## 2018-03-22 MED ORDER — ALPRAZOLAM 1 MG PO TABS: 1.0000 mg | ORAL_TABLET | Freq: Three times a day (TID) | ORAL | 5 refills | Status: DC | PRN

## 2018-03-22 MED ORDER — ZOLPIDEM TARTRATE 10 MG PO TABS: 10.0000 mg | ORAL_TABLET | Freq: Every evening | ORAL | 5 refills | Status: DC | PRN

## 2018-03-22 NOTE — Progress Notes (Signed)
   Subjective:    Patient ID: James Bradley, male    DOB: Oct 12, 1970, 48 y.o.   MRN: [id number]  HPI Here for a well exam. He feels fine.    Review of Systems  Constitutional: Negative.   HENT: Negative.   Eyes: Negative.   Respiratory: Negative.   Cardiovascular: Negative.   Gastrointestinal: Negative.   Genitourinary: Negative.   Musculoskeletal: Negative.   Skin: Negative.   Neurological: Negative.   Psychiatric/Behavioral: Negative.        Objective:   Physical Exam Constitutional:      General: He is not in acute distress.    Appearance: He is well-developed. He is not diaphoretic.  HENT:     Head: Normocephalic and atraumatic.     Right Ear: External ear normal.     Left Ear: External ear normal.     Nose: Nose normal.     Mouth/Throat:     Pharynx: No oropharyngeal exudate.  Eyes:     General: No scleral icterus.       Right eye: No discharge.        Left eye: No discharge.     Conjunctiva/sclera: Conjunctivae normal.     Pupils: Pupils are equal, round, and reactive to light.  Neck:     Musculoskeletal: Neck supple.     Thyroid: No thyromegaly.     Vascular: No JVD.     Trachea: No tracheal deviation.  Cardiovascular:     Rate and Rhythm: Normal rate and regular rhythm.     Heart sounds: Normal heart sounds. No murmur. No friction rub. No gallop.   Pulmonary:     Effort: Pulmonary effort is normal. No respiratory distress.     Breath sounds: Normal breath sounds. No wheezing or rales.  Chest:     Chest wall: No tenderness.  Abdominal:     General: Bowel sounds are normal. There is no distension.     Palpations: Abdomen is soft. There is no mass.     Tenderness: There is no abdominal tenderness. There is no guarding or rebound.  Genitourinary:    Penis: Normal. No tenderness.      Scrotum/Testes: Normal.     Rectum: Guaiac result negative.  Musculoskeletal: Normal range of motion.        General: No tenderness.  Lymphadenopathy:     Cervical:  No cervical adenopathy.  Skin:    General: Skin is warm and dry.     Coloration: Skin is not pale.     Findings: No erythema or rash.  Neurological:     Mental Status: He is alert and oriented to person, place, and time.     Cranial Nerves: No cranial nerve deficit.     Motor: No abnormal muscle tone.     Coordination: Coordination normal.     Deep Tendon Reflexes: Reflexes are normal and symmetric. Reflexes normal.  Psychiatric:        Behavior: Behavior normal.        Thought Content: Thought content normal.        Judgment: Judgment normal.           Assessment & Plan:  Well exam. We discussed diet and exercise. Get fasting labs.  Alysia Penna, MD

## 2018-03-24 ENCOUNTER — Encounter: Payer: Self-pay | Admitting: *Deleted

## 2018-05-12 ENCOUNTER — Telehealth: Payer: Self-pay | Admitting: Family Medicine

## 2018-05-12 NOTE — Telephone Encounter (Signed)
I have called the pharmacy to let them know that Dr. Sarajane Jews has authorized an early refill for the xanax.  They will get this filled for the pt.

## 2018-05-12 NOTE — Telephone Encounter (Signed)
Please tell the pharmacy that an early refill is okay  

## 2018-05-12 NOTE — Telephone Encounter (Signed)
Copied from [place] [id number]. Topic: Quick Communication - See Telephone Encounter >> May 12, 2018 10:14 AM Vernona Rieger wrote: CRM for notification. See Telephone encounter for: 05/12/18.  Patient said he lost his prescription for ALPRAZolam Duanne Moron) 1 MG tablet yesterday, he called pharmacy and they need authorization from Dr Sarajane Jews for them to give him another script. He is aware insurance will not pay for another script right now and he is okay with that. He is having really bad panic attacks..  [id number] [address], [place] [address] Phone: [phone] Fax: [phone]

## 2018-05-12 NOTE — Telephone Encounter (Signed)
Dr Fry please advise. thanks 

## 2018-05-24 ENCOUNTER — Other Ambulatory Visit: Payer: Self-pay

## 2018-05-24 ENCOUNTER — Ambulatory Visit (INDEPENDENT_AMBULATORY_CARE_PROVIDER_SITE_OTHER): Payer: 59 | Admitting: Family Medicine

## 2018-05-24 ENCOUNTER — Encounter: Payer: Self-pay | Admitting: Family Medicine

## 2018-05-24 DIAGNOSIS — M5440 Lumbago with sciatica, unspecified side: Secondary | ICD-10-CM | POA: Diagnosis not present

## 2018-05-24 DIAGNOSIS — G8929 Other chronic pain: Secondary | ICD-10-CM | POA: Diagnosis not present

## 2018-05-24 DIAGNOSIS — F1190 Opioid use, unspecified, uncomplicated: Secondary | ICD-10-CM

## 2018-05-24 NOTE — Progress Notes (Signed)
Subjective:    Patient ID: James Bradley, male    DOB: 1970-04-09, 48 y.o.   MRN: [id number]  HPI Virtual Visit via Video Note  I connected with the patient on 05/24/18 at  2:30 PM EDT by a video enabled telemedicine application and verified that I am speaking with the correct person using two identifiers.  Location patient: home Location provider:work or home office Persons participating in the virtual visit: patient, provider  I discussed the limitations of evaluation and management by telemedicine and the availability of in person appointments. The patient expressed understanding and agreed to proceed.   HPI: Here for pain management. He is doing well.  Indication for chronic opioid: low back pain Medication and dose: Percocet 10-325 # pills per month: 120 Last UDS date: 02-22-18 Opioid Treatment Agreement signed (Y/N): 04-19-17 Opioid Treatment Agreement last reviewed with patient:  05-24-18 NCCSRS reviewed this encounter (include red flags):  05-24-18    ROS: See pertinent positives and negatives per HPI.  Past Medical History:  Diagnosis Date  . Anxiety   . Cancer [facility])    prostate , sees Dr. Jeffie Pollock   . GERD (gastroesophageal reflux disease)   . Herpes labialis   . Insomnia   . Low back pain   . Onychomycosis   . Prostatitis     Past Surgical History:  Procedure Laterality Date  . COLONOSCOPY  August 2013    per Dr. Carol Ada , clear, repeat in 10 yrs   . ESI Dr Lorin Mercy    . microdiskectomy  2009   at L5-S1, per Dr. Kerby Nora   . PROSTATECTOMY  08-20-10   per Dr. Jeffie Pollock    Family History  Problem Relation Age of Onset  . Arthritis Other   . Diabetes Other      Current Outpatient Medications:  .  ALPRAZolam (XANAX) 1 MG tablet, Take 1 tablet (1 mg total) by mouth 3 (three) times daily as needed. for anxiety, Disp: 90 tablet, Rfl: 5 .  oxyCODONE-acetaminophen (PERCOCET) 10-325 MG tablet, Take 1 tablet by mouth every 6 (six) hours as needed for up to 30  days for pain., Disp: 120 tablet, Rfl: 0 .  solifenacin (VESICARE) 5 MG tablet, Take 1 tablet (5 mg total) by mouth daily., Disp: 30 tablet, Rfl: 2 .  terbinafine (LAMISIL) 250 MG tablet, TAKE 1 TABLET BY MOUTH EVERY DAY, Disp: 90 tablet, Rfl: 1 .  valACYclovir (VALTREX) 500 MG tablet, Take 1 tablet (500 mg total) by mouth 2 (two) times daily as needed (cold sores)., Disp: 60 tablet, Rfl: 5 .  zolpidem (AMBIEN) 10 MG tablet, Take 1 tablet (10 mg total) by mouth at bedtime as needed. for sleep, Disp: 30 tablet, Rfl: 5  EXAM:  VITALS per patient if applicable:  GENERAL: alert, oriented, appears well and in no acute distress  HEENT: atraumatic, conjunttiva clear, no obvious abnormalities on inspection of external nose and ears  NECK: normal movements of the head and neck  LUNGS: on inspection no signs of respiratory distress, breathing rate appears normal, no obvious gross SOB, gasping or wheezing  CV: no obvious cyanosis  MS: moves all visible extremities without noticeable abnormality  PSYCH/NEURO: pleasant and cooperative, no obvious depression or anxiety, speech and thought processing grossly intact  ASSESSMENT AND PLAN: Pain management, meds were refilled.  Alysia Penna, MD  Discussed the following assessment and plan:  No diagnosis found.     I discussed the assessment and treatment plan with the patient.  The patient was provided an opportunity to ask questions and all were answered. The patient agreed with the plan and demonstrated an understanding of the instructions.   The patient was advised to call back or seek an in-person evaluation if the symptoms worsen or if the condition fails to improve as anticipated.     Review of Systems     Objective:   Physical Exam        Assessment & Plan:

## 2018-05-25 ENCOUNTER — Telehealth: Payer: Self-pay | Admitting: Family Medicine

## 2018-05-25 MED ORDER — OXYCODONE-ACETAMINOPHEN 10-325 MG PO TABS: 1.0000 | ORAL_TABLET | Freq: Four times a day (QID) | ORAL | 0 refills | Status: DC | PRN

## 2018-05-25 NOTE — Telephone Encounter (Signed)
Copied from [place] [id number]. Topic: General - Other >> May 25, 2018  8:31 AM Keene Breath wrote: Reason for CRM: Patient called to check the status of his pain medication prescription.  Patient stated that he had an appt. With the doctor yesterday and he was supposed to send the script to the pharmacy.  The pharmacy does not have the script and patient would like to know when he would be able to get the medication.  CB# [phone]

## 2018-05-25 NOTE — Telephone Encounter (Signed)
Dr. Sarajane Jews please advise on RX for the pain meds.  Thanks

## 2018-05-25 NOTE — Telephone Encounter (Signed)
These were sent in today  

## 2018-07-20 ENCOUNTER — Encounter: Payer: Self-pay | Admitting: Family Medicine

## 2018-07-20 ENCOUNTER — Telehealth: Payer: Self-pay | Admitting: *Deleted

## 2018-07-20 ENCOUNTER — Other Ambulatory Visit: Payer: Self-pay

## 2018-07-20 ENCOUNTER — Ambulatory Visit (INDEPENDENT_AMBULATORY_CARE_PROVIDER_SITE_OTHER): Payer: 59 | Admitting: Family Medicine

## 2018-07-20 DIAGNOSIS — Z20822 Contact with and (suspected) exposure to covid-19: Secondary | ICD-10-CM

## 2018-07-20 DIAGNOSIS — Z209 Contact with and (suspected) exposure to unspecified communicable disease: Secondary | ICD-10-CM

## 2018-07-20 NOTE — Telephone Encounter (Signed)
-----   Message from Elie Confer, [place] sent at 07/20/2018  8:29 AM EDT ----- Good morning!!! Dr. Sarajane Jews would like to have this patient tested for covid 19.  He will be able to come in on Wednesday morning for the testing.  He has been exposed by a co-worker that tested positive for covid.  He has no symptoms at this time.  His job is requiring him to be tested in order to return to work.

## 2018-07-20 NOTE — Progress Notes (Signed)
Subjective:    Patient ID: James Bradley, male    DOB: [birth year], 48 y.o.   MRN: [id number]  HPI Virtual Visit via Video Note  I connected with the patient on 07/20/18 at  8:00 AM EDT by a video enabled telemedicine application and verified that I am speaking with the correct person using two identifiers.  Location patient: home Location provider:work or home office Persons participating in the virtual visit: patient, provider  I discussed the limitations of evaluation and management by telemedicine and the availability of in person appointments. The patient expressed understanding and agreed to proceed.   HPI: Here asking to be tested for the Covid-19 virus. He worked closely with a coworker for 2 days last week, and now that coworker has reported feeling achy and fatigued, with SOB and headache and fever. The coworker was sent home and was tested for the virus yesterday. No results are available yet. Infant feels great and has no symptoms, but he was sent home from work and he cannot return until he tests negative for the virus.    ROS: See pertinent positives and negatives per HPI.  Past Medical History:  Diagnosis Date  . Anxiety   . Cancer [facility])    prostate , sees Dr. Jeffie Pollock   . GERD (gastroesophageal reflux disease)   . Herpes labialis   . Insomnia   . Low back pain   . Onychomycosis   . Prostatitis     Past Surgical History:  Procedure Laterality Date  . COLONOSCOPY  August 2013    per Dr. Carol Ada , clear, repeat in 10 yrs   . ESI Dr Lorin Mercy    . microdiskectomy  2009   at L5-S1, per Dr. Kerby Nora   . PROSTATECTOMY  08-20-10   per Dr. Jeffie Pollock    Family History  Problem Relation Age of Onset  . Arthritis Other   . Diabetes Other      Current Outpatient Medications:  .  ALPRAZolam (XANAX) 1 MG tablet, Take 1 tablet (1 mg total) by mouth 3 (three) times daily as needed. for anxiety, Disp: 90 tablet, Rfl: 5 .  [START ON 07/25/2018] oxyCODONE-acetaminophen  (PERCOCET) 10-325 MG tablet, Take 1 tablet by mouth every 6 (six) hours as needed for up to 30 days for pain., Disp: 120 tablet, Rfl: 0 .  solifenacin (VESICARE) 5 MG tablet, Take 1 tablet (5 mg total) by mouth daily., Disp: 30 tablet, Rfl: 2 .  terbinafine (LAMISIL) 250 MG tablet, TAKE 1 TABLET BY MOUTH EVERY DAY, Disp: 90 tablet, Rfl: 1 .  valACYclovir (VALTREX) 500 MG tablet, Take 1 tablet (500 mg total) by mouth 2 (two) times daily as needed (cold sores)., Disp: 60 tablet, Rfl: 5 .  zolpidem (AMBIEN) 10 MG tablet, Take 1 tablet (10 mg total) by mouth at bedtime as needed. for sleep, Disp: 30 tablet, Rfl: 5  EXAM:  VITALS per patient if applicable:  GENERAL: alert, oriented, appears well and in no acute distress  HEENT: atraumatic, conjunttiva clear, no obvious abnormalities on inspection of external nose and ears  NECK: normal movements of the head and neck  LUNGS: on inspection no signs of respiratory distress, breathing rate appears normal, no obvious gross SOB, gasping or wheezing  CV: no obvious cyanosis  MS: moves all visible extremities without noticeable abnormality  PSYCH/NEURO: pleasant and cooperative, no obvious depression or anxiety, speech and thought processing grossly intact  ASSESSMENT AND PLAN: Possible exposure to the Covid virus. We  will write him out of work from 07-19-18 until 07-26-18. We will arrange for him to be tested as well. He will quarantine at home and notify us if he develops any symptoms.  Alysia Penna, MD  Discussed the following assessment and plan:  No diagnosis found.     I discussed the assessment and treatment plan with the patient. The patient was provided an opportunity to ask questions and all were answered. The patient agreed with the plan and demonstrated an understanding of the instructions.   The patient was advised to call back or seek an in-person evaluation if the symptoms worsen or if the condition fails to improve as anticipated.      Review of Systems     Objective:   Physical Exam        Assessment & Plan:

## 2018-07-20 NOTE — Telephone Encounter (Signed)
Spoke with patient, scheduled him for COVID 19 test tomorrow morning at 8:30 am at [facility].  Testing protocol reviewed.

## 2018-07-20 NOTE — Telephone Encounter (Signed)
Copied from [place] [phone]. Topic: General - Inquiry >> Jul 20, 2018  8:56 AM Virl Axe D wrote: Reason for CRM: Pt stated that he misspoke about how he was exposed to Covid-19. It was his neighbor and not his co-worker. He is asking if the work note sent to him on Mychart can be corrected to state exposure came from his neighbor and resent to his MyChart. Please advise.

## 2018-07-20 NOTE — Telephone Encounter (Signed)
The updated letter is ready

## 2018-07-21 ENCOUNTER — Other Ambulatory Visit: Payer: Self-pay

## 2018-07-21 DIAGNOSIS — Z20822 Contact with and (suspected) exposure to covid-19: Secondary | ICD-10-CM

## 2018-07-27 LAB — NOVEL CORONAVIRUS, NAA: SARS-CoV-2, NAA: NOT DETECTED

## 2018-08-24 ENCOUNTER — Encounter: Payer: Self-pay | Admitting: Family Medicine

## 2018-08-24 ENCOUNTER — Telehealth (INDEPENDENT_AMBULATORY_CARE_PROVIDER_SITE_OTHER): Payer: 59 | Admitting: Family Medicine

## 2018-08-24 DIAGNOSIS — F1190 Opioid use, unspecified, uncomplicated: Secondary | ICD-10-CM

## 2018-08-24 DIAGNOSIS — F900 Attention-deficit hyperactivity disorder, predominantly inattentive type: Secondary | ICD-10-CM | POA: Diagnosis not present

## 2018-08-24 DIAGNOSIS — M5440 Lumbago with sciatica, unspecified side: Secondary | ICD-10-CM

## 2018-08-24 DIAGNOSIS — G8929 Other chronic pain: Secondary | ICD-10-CM

## 2018-08-24 MED ORDER — OXYCODONE-ACETAMINOPHEN 10-325 MG PO TABS: 1.0000 | ORAL_TABLET | Freq: Four times a day (QID) | ORAL | 0 refills | Status: DC | PRN

## 2018-08-24 MED ORDER — AMPHETAMINE-DEXTROAMPHET ER 10 MG PO CP24: 10.0000 mg | ORAL_CAPSULE | Freq: Every day | ORAL | 0 refills | Status: DC

## 2018-08-24 NOTE — Progress Notes (Signed)
Virtual Visit via Video Note  I connected with the patient on 08/24/18 at 11:30 AM EDT by a video enabled telemedicine application and verified that I am speaking with the correct person using two identifiers.  Location patient: home Location provider:work or home office Persons participating in the virtual visit: patient, provider  I discussed the limitations of evaluation and management by telemedicine and the availability of in person appointments. The patient expressed understanding and agreed to proceed.   HPI: Here for pain management and also for another issue. His back pain has been well controlled. The other issue is a problem with focusing, especially at work. He has had a problem with staying on task most of his life, but lately this has been more difficult. He finds that he is forgetting to do things at work and he is missing deadlines because he is frequently distracted from the task he is working on.  Indication for chronic opioid: low back pain Medication and dose: Percocet 10-325 # pills per month: 120 Last UDS date: 02-22-18 Opioid Treatment Agreement signed (Y/N): 04-19-17 Opioid Treatment Agreement last reviewed with patient:  08-24-18 NCCSRS reviewed this encounter (include red flags): Yes    ROS: See pertinent positives and negatives per HPI.  Past Medical History:  Diagnosis Date  . Anxiety   . Cancer [facility])    prostate , sees Dr. Jeffie Pollock   . GERD (gastroesophageal reflux disease)   . Herpes labialis   . Insomnia   . Low back pain   . Onychomycosis   . Prostatitis     Past Surgical History:  Procedure Laterality Date  . COLONOSCOPY  August 2013    per Dr. Carol Ada , clear, repeat in 10 yrs   . ESI Dr Lorin Mercy    . microdiskectomy  2009   at L5-S1, per Dr. Kerby Nora   . PROSTATECTOMY  08-20-10   per Dr. Jeffie Pollock    Family History  Problem Relation Age of Onset  . Arthritis Other   . Diabetes Other      Current Outpatient Medications:  .  ALPRAZolam  (XANAX) 1 MG tablet, Take 1 tablet (1 mg total) by mouth 3 (three) times daily as needed. for anxiety, Disp: 90 tablet, Rfl: 5 .  amphetamine-dextroamphetamine (ADDERALL XR) 10 MG 24 hr capsule, Take 1 capsule (10 mg total) by mouth daily., Disp: 30 capsule, Rfl: 0 .  [START ON 10/25/2018] oxyCODONE-acetaminophen (PERCOCET) 10-325 MG tablet, Take 1 tablet by mouth every 6 (six) hours as needed for pain., Disp: 120 tablet, Rfl: 0 .  solifenacin (VESICARE) 5 MG tablet, Take 1 tablet (5 mg total) by mouth daily., Disp: 30 tablet, Rfl: 2 .  terbinafine (LAMISIL) 250 MG tablet, TAKE 1 TABLET BY MOUTH EVERY DAY, Disp: 90 tablet, Rfl: 1 .  valACYclovir (VALTREX) 500 MG tablet, Take 1 tablet (500 mg total) by mouth 2 (two) times daily as needed (cold sores)., Disp: 60 tablet, Rfl: 5 .  zolpidem (AMBIEN) 10 MG tablet, Take 1 tablet (10 mg total) by mouth at bedtime as needed. for sleep, Disp: 30 tablet, Rfl: 5  EXAM:  VITALS per patient if applicable:  GENERAL: alert, oriented, appears well and in no acute distress  HEENT: atraumatic, conjunttiva clear, no obvious abnormalities on inspection of external nose and ears  NECK: normal movements of the head and neck  LUNGS: on inspection no signs of respiratory distress, breathing rate appears normal, no obvious gross SOB, gasping or wheezing  CV: no obvious cyanosis  MS: moves all visible extremities without noticeable abnormality  PSYCH/NEURO: pleasant and cooperative, no obvious depression or anxiety, speech and thought processing grossly intact  ASSESSMENT AND PLAN: Pain management, meds were refilled. For the attention problem he will try Adderall XR 10 mg daily. He will follow up with [place] in 3-4 weeks to discuss this, check his BP, etc.  Alysia Penna, MD  Discussed the following assessment and plan:  Chronic bilateral low back pain with sciatica, sciatica laterality unspecified     I discussed the assessment and treatment plan with the  patient. The patient was provided an opportunity to ask questions and all were answered. The patient agreed with the plan and demonstrated an understanding of the instructions.   The patient was advised to call back or seek an in-person evaluation if the symptoms worsen or if the condition fails to improve as anticipated.

## 2018-09-22 ENCOUNTER — Other Ambulatory Visit: Payer: Self-pay | Admitting: Family Medicine

## 2018-09-22 ENCOUNTER — Encounter: Payer: Self-pay | Admitting: Family Medicine

## 2018-09-22 ENCOUNTER — Ambulatory Visit: Payer: 59 | Admitting: Family Medicine

## 2018-09-22 VITALS — BP 120/68 | HR 147 | Temp 97.6°F | Wt 144.2 lb

## 2018-09-22 DIAGNOSIS — F900 Attention-deficit hyperactivity disorder, predominantly inattentive type: Secondary | ICD-10-CM

## 2018-09-22 DIAGNOSIS — G4700 Insomnia, unspecified: Secondary | ICD-10-CM

## 2018-09-22 DIAGNOSIS — F418 Other specified anxiety disorders: Secondary | ICD-10-CM | POA: Diagnosis not present

## 2018-09-22 MED ORDER — AMPHETAMINE-DEXTROAMPHETAMINE 20 MG PO TABS: 20.0000 mg | ORAL_TABLET | Freq: Every day | ORAL | 0 refills | Status: DC

## 2018-09-22 MED ORDER — ZOLPIDEM TARTRATE 10 MG PO TABS: 10.0000 mg | ORAL_TABLET | Freq: Every evening | ORAL | 5 refills | Status: DC | PRN

## 2018-09-22 MED ORDER — ALPRAZOLAM 1 MG PO TABS: 1.0000 mg | ORAL_TABLET | Freq: Three times a day (TID) | ORAL | 5 refills | Status: DC | PRN

## 2018-09-22 NOTE — Progress Notes (Signed)
   Subjective:    Patient ID: James Bradley, male    DOB: 01/11/1971, 48 y.o.   MRN: [id number]  HPI Here for med refills and to discuss his ADHD. hje has been taking Adderall XR 10 mg daily. He would like to increase the dose for it to be more effective, but he wants to switch to immediate release so it will be out of his system by the time he gets home from work. His depression, anxiety, and insomnia are all stable.   Review of Systems  Constitutional: Negative.   Respiratory: Negative.   Cardiovascular: Negative.   Neurological: Negative.   Psychiatric/Behavioral: Positive for decreased concentration. Negative for agitation, confusion, dysphoric mood, hallucinations and sleep disturbance. The patient is not nervous/anxious.        Objective:   Physical Exam Constitutional:      Appearance: Normal appearance.  Cardiovascular:     Rate and Rhythm: Normal rate and regular rhythm.     Pulses: Normal pulses.     Heart sounds: Normal heart sounds.  Pulmonary:     Effort: Pulmonary effort is normal.     Breath sounds: Normal breath sounds.  Neurological:     General: No focal deficit present.     Mental Status: He is alert and oriented to person, place, and time.  Psychiatric:        Mood and Affect: Mood normal.        Behavior: Behavior normal.        Thought Content: Thought content normal.        Judgment: Judgment normal.           Assessment & Plan:  His depression and anxiety med and sleep med are refilled. For the ADHD we will switch to immediate release Adderall and increase the dose to 20 mg. Alysia Penna, MD

## 2018-10-18 ENCOUNTER — Other Ambulatory Visit: Payer: Self-pay | Admitting: Family Medicine

## 2018-10-19 NOTE — Telephone Encounter (Signed)
Routing to PCP for approval.

## 2018-10-19 NOTE — Telephone Encounter (Signed)
He already has refills

## 2018-10-20 NOTE — Telephone Encounter (Signed)
Okay for refill?  

## 2018-11-18 ENCOUNTER — Encounter: Payer: Self-pay | Admitting: Family Medicine

## 2018-11-18 ENCOUNTER — Telehealth (INDEPENDENT_AMBULATORY_CARE_PROVIDER_SITE_OTHER): Payer: 59 | Admitting: Family Medicine

## 2018-11-18 ENCOUNTER — Other Ambulatory Visit: Payer: Self-pay

## 2018-11-18 DIAGNOSIS — G8929 Other chronic pain: Secondary | ICD-10-CM | POA: Diagnosis not present

## 2018-11-18 DIAGNOSIS — M5440 Lumbago with sciatica, unspecified side: Secondary | ICD-10-CM | POA: Diagnosis not present

## 2018-11-18 MED ORDER — OXYCODONE-ACETAMINOPHEN 10-325 MG PO TABS: 1.0000 | ORAL_TABLET | Freq: Four times a day (QID) | ORAL | 0 refills | Status: DC | PRN

## 2018-11-18 MED ORDER — AMPHETAMINE-DEXTROAMPHETAMINE 20 MG PO TABS: 20.0000 mg | ORAL_TABLET | Freq: Every day | ORAL | 0 refills | Status: DC

## 2018-11-18 MED ORDER — ZOLPIDEM TARTRATE 10 MG PO TABS: 10.0000 mg | ORAL_TABLET | Freq: Every evening | ORAL | 5 refills | Status: DC | PRN

## 2018-11-18 NOTE — Progress Notes (Signed)
Virtual Visit via Video Note  I connected with the patient on 11/18/18 at  8:00 AM EST by a video enabled telemedicine application and verified that I am speaking with the correct person using two identifiers.  Location patient: home Location provider:work or home office Persons participating in the virtual visit: patient, provider  I discussed the limitations of evaluation and management by telemedicine and the availability of in person appointments. The patient expressed understanding and agreed to proceed.   HPI: Here for pain management, he is doing well.  Indication for chronic opioid: low back pain Medication and dose: Percocet 10-325 # pills per month: 120 Last UDS date: 02-22-18 Opioid Treatment Agreement signed (Y/N): 04-19-17 Opioid Treatment Agreement last reviewed with patient:  11-18-18 NCCSRS reviewed this encounter (include red flags): Yes    ROS: See pertinent positives and negatives per HPI.  Past Medical History:  Diagnosis Date  . Anxiety   . Cancer [facility])    prostate , sees Dr. Jeffie Pollock   . GERD (gastroesophageal reflux disease)   . Herpes labialis   . Insomnia   . Low back pain   . Onychomycosis   . Prostatitis     Past Surgical History:  Procedure Laterality Date  . COLONOSCOPY  August 2013    per Dr. Carol Ada , clear, repeat in 10 yrs   . ESI Dr Lorin Mercy    . microdiskectomy  2009   at L5-S1, per Dr. Kerby Nora   . PROSTATECTOMY  08-20-10   per Dr. Jeffie Pollock    Family History  Problem Relation Age of Onset  . Arthritis Other   . Diabetes Other      Current Outpatient Medications:  .  ALPRAZolam (XANAX) 1 MG tablet, Take 1 tablet (1 mg total) by mouth 3 (three) times daily as needed. for anxiety, Disp: 90 tablet, Rfl: 5 .  [START ON 01/22/2019] amphetamine-dextroamphetamine (ADDERALL) 20 MG tablet, Take 1 tablet (20 mg total) by mouth daily., Disp: 30 tablet, Rfl: 0 .  [START ON 01/25/2019] oxyCODONE-acetaminophen (PERCOCET) 10-325 MG tablet, Take 1  tablet by mouth every 6 (six) hours as needed for pain., Disp: 120 tablet, Rfl: 0 .  solifenacin (VESICARE) 5 MG tablet, Take 1 tablet (5 mg total) by mouth daily., Disp: 30 tablet, Rfl: 2 .  terbinafine (LAMISIL) 250 MG tablet, TAKE 1 TABLET BY MOUTH EVERY DAY, Disp: 90 tablet, Rfl: 1 .  valACYclovir (VALTREX) 500 MG tablet, Take 1 tablet (500 mg total) by mouth 2 (two) times daily as needed (cold sores)., Disp: 60 tablet, Rfl: 5 .  [START ON 11/22/2018] zolpidem (AMBIEN) 10 MG tablet, Take 1 tablet (10 mg total) by mouth at bedtime as needed. for sleep, Disp: 30 tablet, Rfl: 5  EXAM:  VITALS per patient if applicable:  GENERAL: alert, oriented, appears well and in no acute distress  HEENT: atraumatic, conjunttiva clear, no obvious abnormalities on inspection of external nose and ears  NECK: normal movements of the head and neck  LUNGS: on inspection no signs of respiratory distress, breathing rate appears normal, no obvious gross SOB, gasping or wheezing  CV: no obvious cyanosis  MS: moves all visible extremities without noticeable abnormality  PSYCH/NEURO: pleasant and cooperative, no obvious depression or anxiety, speech and thought processing grossly intact  ASSESSMENT AND PLAN: Pain management, meds were refilled.  Alysia Penna, MD  Discussed the following assessment and plan:  No diagnosis found.     I discussed the assessment and treatment plan with the patient.  The patient was provided an opportunity to ask questions and all were answered. The patient agreed with the plan and demonstrated an understanding of the instructions.   The patient was advised to call back or seek an in-person evaluation if the symptoms worsen or if the condition fails to improve as anticipated.

## 2019-02-23 ENCOUNTER — Other Ambulatory Visit: Payer: Self-pay

## 2019-02-23 ENCOUNTER — Ambulatory Visit: Payer: 59 | Admitting: Family Medicine

## 2019-02-23 ENCOUNTER — Encounter: Payer: Self-pay | Admitting: Family Medicine

## 2019-02-23 VITALS — BP 100/62 | HR 82 | Temp 97.7°F | Wt 143.8 lb

## 2019-02-23 DIAGNOSIS — G8929 Other chronic pain: Secondary | ICD-10-CM

## 2019-02-23 DIAGNOSIS — F1190 Opioid use, unspecified, uncomplicated: Secondary | ICD-10-CM

## 2019-02-23 DIAGNOSIS — M5440 Lumbago with sciatica, unspecified side: Secondary | ICD-10-CM | POA: Diagnosis not present

## 2019-02-23 MED ORDER — OXYCODONE-ACETAMINOPHEN 10-325 MG PO TABS: 1.0000 | ORAL_TABLET | Freq: Four times a day (QID) | ORAL | 0 refills | Status: DC | PRN

## 2019-02-23 MED ORDER — AMPHETAMINE-DEXTROAMPHETAMINE 20 MG PO TABS: 20.0000 mg | ORAL_TABLET | Freq: Every day | ORAL | 0 refills | Status: DC

## 2019-02-23 NOTE — Progress Notes (Signed)
   Subjective:    Patient ID: James Bradley, male    DOB: [birth year], 49 y.o.   MRN: [id number]  HPI Here for pain management, he is doing well. Indication for chronic opioid: low back pain Medication and dose: Percocet 10-325  # pills per month: 120 Last UDS date: 02-23-19 Opioid Treatment Agreement signed (Y/N): 04-19-17 Opioid Treatment Agreement last reviewed with patient:  02-23-19 NCCSRS reviewed this encounter (include red flags): Yes    Review of Systems     Objective:   Physical Exam        Assessment & Plan:  Pain management, meds were refilled.  Alysia Penna, MD

## 2019-02-27 LAB — PAIN MGMT, PROFILE 8 W/CONF, U
6 Acetylmorphine: NEGATIVE ng/mL
Alcohol Metabolites: POSITIVE ng/mL — AB (ref <500)
Alphahydroxyalprazolam: 3049 ng/mL
Alphahydroxymidazolam: NEGATIVE ng/mL
Alphahydroxytriazolam: NEGATIVE ng/mL
Aminoclonazepam: NEGATIVE ng/mL
Amphetamine: 23794 ng/mL
Amphetamines: POSITIVE ng/mL
Benzodiazepines: POSITIVE ng/mL
Buprenorphine, Urine: NEGATIVE ng/mL
Cocaine Metabolite: NEGATIVE ng/mL
Codeine: NEGATIVE ng/mL
Creatinine: >300 mg/dL
Ethyl Glucuronide (ETG): 2559 ng/mL
Ethyl Sulfate (ETS): 4346 ng/mL
Hydrocodone: NEGATIVE ng/mL
Hydromorphone: NEGATIVE ng/mL
Hydroxyethylflurazepam: NEGATIVE ng/mL
Lorazepam: NEGATIVE ng/mL
MDMA: NEGATIVE ng/mL
Marijuana Metabolite: NEGATIVE ng/mL
Methamphetamine: NEGATIVE ng/mL
Morphine: NEGATIVE ng/mL
Nordiazepam: NEGATIVE ng/mL
Norhydrocodone: NEGATIVE ng/mL
Noroxycodone: 401 ng/mL
Opiates: NEGATIVE ng/mL
Oxazepam: NEGATIVE ng/mL
Oxidant: NEGATIVE ug/mL
Oxycodone: 89 ng/mL
Oxycodone: POSITIVE ng/mL
Oxymorphone: NEGATIVE ng/mL
Temazepam: NEGATIVE ng/mL
pH: 5.5 (ref 4.5–9.0)

## 2019-03-23 ENCOUNTER — Ambulatory Visit (INDEPENDENT_AMBULATORY_CARE_PROVIDER_SITE_OTHER): Payer: 59 | Admitting: Family Medicine

## 2019-03-23 ENCOUNTER — Encounter: Payer: Self-pay | Admitting: Family Medicine

## 2019-03-23 ENCOUNTER — Other Ambulatory Visit: Payer: Self-pay

## 2019-03-23 VITALS — BP 108/58 | HR 103 | Temp 98.4°F | Ht 65.0 in | Wt 144.8 lb

## 2019-03-23 DIAGNOSIS — Z0000 Encounter for general adult medical examination without abnormal findings: Secondary | ICD-10-CM

## 2019-03-23 LAB — CBC WITH DIFFERENTIAL/PLATELET
Basophils Absolute: 0.1 10*3/uL (ref 0.0–0.1)
Basophils Relative: 1.6 % (ref 0.0–3.0)
Eosinophils Absolute: 0 10*3/uL (ref 0.0–0.7)
Eosinophils Relative: 0.8 % (ref 0.0–5.0)
HCT: 41.8 % (ref 39.0–52.0)
Hemoglobin: 13.6 g/dL (ref 13.0–17.0)
Lymphocytes Relative: 47.4 % — ABNORMAL HIGH (ref 12.0–46.0)
Lymphs Abs: 2.4 10*3/uL (ref 0.7–4.0)
MCHC: 32.6 g/dL (ref 30.0–36.0)
MCV: 72.6 fl — ABNORMAL LOW (ref 78.0–100.0)
Monocytes Absolute: 0.3 10*3/uL (ref 0.1–1.0)
Monocytes Relative: 5.8 % (ref 3.0–12.0)
Neutro Abs: 2.3 10*3/uL (ref 1.4–7.7)
Neutrophils Relative %: 44.4 % (ref 43.0–77.0)
Platelets: 371 10*3/uL (ref 150.0–400.0)
RBC: 5.75 Mil/uL (ref 4.22–5.81)
RDW: 15.9 % — ABNORMAL HIGH (ref 11.5–15.5)
WBC: 5.1 10*3/uL (ref 4.0–10.5)

## 2019-03-23 LAB — BASIC METABOLIC PANEL
BUN: 16 mg/dL (ref 6–23)
CO2: 28 mEq/L (ref 19–32)
Calcium: 9.5 mg/dL (ref 8.4–10.5)
Chloride: 96 mEq/L (ref 96–112)
Creatinine, Ser: 0.88 mg/dL (ref 0.40–1.50)
GFR: 91.58 mL/min (ref >60.00)
Glucose, Bld: 70 mg/dL (ref 70–99)
Potassium: 3.9 mEq/L (ref 3.5–5.1)
Sodium: 135 mEq/L (ref 135–145)

## 2019-03-23 LAB — LIPID PANEL
Cholesterol: 235 mg/dL — ABNORMAL HIGH (ref 0–200)
HDL: 87.2 mg/dL (ref >39.00)
NonHDL: 147.76
Total CHOL/HDL Ratio: 3
Triglycerides: 236 mg/dL — ABNORMAL HIGH (ref 0.0–149.0)
VLDL: 47.2 mg/dL — ABNORMAL HIGH (ref 0.0–40.0)

## 2019-03-23 LAB — HEPATIC FUNCTION PANEL
ALT: 45 U/L (ref 0–53)
AST: 40 U/L — ABNORMAL HIGH (ref 0–37)
Albumin: 4.9 g/dL (ref 3.5–5.2)
Alkaline Phosphatase: 50 U/L (ref 39–117)
Bilirubin, Direct: 0.3 mg/dL (ref 0.0–0.3)
Total Bilirubin: 1.8 mg/dL — ABNORMAL HIGH (ref 0.2–1.2)
Total Protein: 7.5 g/dL (ref 6.0–8.3)

## 2019-03-23 LAB — TSH: TSH: 0.21 u[IU]/mL — ABNORMAL LOW (ref 0.35–4.50)

## 2019-03-23 LAB — PSA: PSA: 0.03 ng/mL — ABNORMAL LOW (ref 0.10–4.00)

## 2019-03-23 LAB — LDL CHOLESTEROL, DIRECT: Direct LDL: 89 mg/dL

## 2019-03-23 MED ORDER — ALPRAZOLAM 1 MG PO TABS: 1.0000 mg | ORAL_TABLET | Freq: Three times a day (TID) | ORAL | 5 refills | Status: DC | PRN

## 2019-03-23 MED ORDER — VALACYCLOVIR HCL 500 MG PO TABS: 500.0000 mg | ORAL_TABLET | Freq: Two times a day (BID) | ORAL | 11 refills | Status: DC | PRN

## 2019-03-23 NOTE — Progress Notes (Signed)
   Subjective:    Patient ID: James Bradley, male    DOB: Feb 12, 1970, 49 y.o.   MRN: [id number]  HPI Here for a well exam. He feels well. He has been stressed this past week since his grandmother had a stroke. She seems to be stable now.    Review of Systems  Constitutional: Negative.   HENT: Negative.   Eyes: Negative.   Respiratory: Negative.   Cardiovascular: Negative.   Gastrointestinal: Negative.   Genitourinary: Negative.   Musculoskeletal: Negative.   Skin: Negative.   Neurological: Negative.   Psychiatric/Behavioral: Negative.        Objective:   Physical Exam Constitutional:      General: He is not in acute distress.    Appearance: He is well-developed. He is not diaphoretic.  HENT:     Head: Normocephalic and atraumatic.     Right Ear: External ear normal.     Left Ear: External ear normal.     Nose: Nose normal.     Mouth/Throat:     Pharynx: No oropharyngeal exudate.  Eyes:     General: No scleral icterus.       Right eye: No discharge.        Left eye: No discharge.     Conjunctiva/sclera: Conjunctivae normal.     Pupils: Pupils are equal, round, and reactive to light.  Neck:     Thyroid: No thyromegaly.     Vascular: No JVD.     Trachea: No tracheal deviation.  Cardiovascular:     Rate and Rhythm: Normal rate and regular rhythm.     Heart sounds: Normal heart sounds. No murmur. No friction rub. No gallop.   Pulmonary:     Effort: Pulmonary effort is normal. No respiratory distress.     Breath sounds: Normal breath sounds. No wheezing or rales.  Chest:     Chest wall: No tenderness.  Abdominal:     General: Bowel sounds are normal. There is no distension.     Palpations: Abdomen is soft. There is no mass.     Tenderness: There is no abdominal tenderness. There is no guarding or rebound.  Genitourinary:    Penis: Normal. No tenderness.      Testes: Normal.     Rectum: Normal. Guaiac result negative.     Comments: Prostate is absent   Musculoskeletal:        General: No tenderness. Normal range of motion.     Cervical back: Neck supple.  Lymphadenopathy:     Cervical: No cervical adenopathy.  Skin:    General: Skin is warm and dry.     Coloration: Skin is not pale.     Findings: No erythema or rash.  Neurological:     Mental Status: He is alert and oriented to person, place, and time.     Cranial Nerves: No cranial nerve deficit.     Motor: No abnormal muscle tone.     Coordination: Coordination normal.     Deep Tendon Reflexes: Reflexes are normal and symmetric. Reflexes normal.  Psychiatric:        Behavior: Behavior normal.        Thought Content: Thought content normal.        Judgment: Judgment normal.           Assessment & Plan:  Well exam. We discussed diet and exercise. Get fasting labs. Alysia Penna, MD

## 2019-04-01 ENCOUNTER — Ambulatory Visit: Payer: 59 | Attending: Internal Medicine

## 2019-04-01 DIAGNOSIS — Z23 Encounter for immunization: Secondary | ICD-10-CM

## 2019-04-01 NOTE — Progress Notes (Signed)
   Covid-19 Vaccination Clinic  Name:  James Bradley    MRN: [id number] DOB: October 16, 1970  04/01/2019  [name] was observed post Covid-19 immunization for 15 minutes without incident. He was provided with Vaccine Information Sheet and instruction to access the V-Safe system.   [name] was instructed to call 911 with any severe reactions post vaccine: Marland Kitchen Difficulty breathing  . Swelling of face and throat  . A fast heartbeat  . A bad rash all over body  . Dizziness and weakness   Immunizations Administered    Name Date Dose VIS Date Route   Pfizer COVID-19 Vaccine 04/01/2019  8:33 AM 0.3 mL 12/24/2018 Intramuscular   Manufacturer: [place]   Lot: [id number]   [place]: [id number]

## 2019-04-19 ENCOUNTER — Telehealth: Payer: 59 | Admitting: Family Medicine

## 2019-04-20 NOTE — Progress Notes (Signed)
Cancelled.  

## 2019-04-26 ENCOUNTER — Ambulatory Visit: Payer: 59 | Attending: Internal Medicine

## 2019-04-26 DIAGNOSIS — Z23 Encounter for immunization: Secondary | ICD-10-CM

## 2019-04-26 NOTE — Progress Notes (Signed)
   Covid-19 Vaccination Clinic  Name:  James Bradley    MRN: [id number] DOB: 11/14/1970  04/26/2019  [name] was observed post Covid-19 immunization for 15 minutes without incident. He was provided with Vaccine Information Sheet and instruction to access the V-Safe system.   [name] was instructed to call 911 with any severe reactions post vaccine: Marland Kitchen Difficulty breathing  . Swelling of face and throat  . A fast heartbeat  . A bad rash all over body  . Dizziness and weakness   Immunizations Administered    Name Date Dose VIS Date Route   Pfizer COVID-19 Vaccine 04/26/2019  8:42 AM 0.3 mL 12/24/2018 Intramuscular   Manufacturer: [place]   Lot: [id number]   [place]: [id number]

## 2019-05-09 ENCOUNTER — Other Ambulatory Visit: Payer: Self-pay

## 2019-05-09 ENCOUNTER — Ambulatory Visit (INDEPENDENT_AMBULATORY_CARE_PROVIDER_SITE_OTHER): Payer: 59 | Admitting: Family Medicine

## 2019-05-09 ENCOUNTER — Encounter: Payer: Self-pay | Admitting: Family Medicine

## 2019-05-09 VITALS — BP 120/80 | HR 68 | Temp 97.8°F | Ht 65.0 in | Wt 147.0 lb

## 2019-05-09 DIAGNOSIS — M67431 Ganglion, right wrist: Secondary | ICD-10-CM

## 2019-05-09 DIAGNOSIS — H6123 Impacted cerumen, bilateral: Secondary | ICD-10-CM | POA: Diagnosis not present

## 2019-05-09 NOTE — Patient Instructions (Addendum)
Health Maintenance Due  Topic Date Due  . HIV Screening  Never done  . COLONOSCOPY  12/25/2018  . TETANUS/TDAP  03/31/2019    Depression screen PHQ 2/9 05/09/2019 03/22/2018  Decreased Interest 0 1  Down, Depressed, Hopeless 1 0  PHQ - 2 Score 1 1

## 2019-05-09 NOTE — Progress Notes (Signed)
   Subjective:    Patient ID: James Bradley, male    DOB: 1970/06/09, 49 y.o.   MRN: [id number]  HPI Here for 2 issues. First about 3 weeks ago a painful lump appeared on the right wrist. No recent trauma. Also about one week ago he had a decrease of hearing in the right ear. No pain or sinus congestion.    Review of Systems  Constitutional: Negative.   HENT: Positive for hearing loss. Negative for congestion, ear discharge and ear pain.   Eyes: Negative.   Respiratory: Negative.   Cardiovascular: Negative.   Musculoskeletal: Positive for joint swelling.       Objective:   Physical Exam Constitutional:      Appearance: Normal appearance.  HENT:     Ears:     Comments: Both ear canals are full of cerumen     Nose: Nose normal.     Mouth/Throat:     Pharynx: Oropharynx is clear.  Eyes:     Conjunctiva/sclera: Conjunctivae normal.  Cardiovascular:     Rate and Rhythm: Normal rate and regular rhythm.     Pulses: Normal pulses.     Heart sounds: Normal heart sounds.  Pulmonary:     Effort: Pulmonary effort is normal.     Breath sounds: Normal breath sounds.  Musculoskeletal:     Comments: The dorsal right wrist has a 6 mm tender firm mobile lump  Lymphadenopathy:     Cervical: No cervical adenopathy.  Neurological:     Mental Status: He is alert.           Assessment & Plan:  For the ganglion cyst, we will refer him to Hand Surgery. For the cerumen impactions, these were irrigated clear with water.  Alysia Penna, MD

## 2019-05-20 ENCOUNTER — Telehealth (INDEPENDENT_AMBULATORY_CARE_PROVIDER_SITE_OTHER): Payer: 59 | Admitting: Family Medicine

## 2019-05-20 ENCOUNTER — Other Ambulatory Visit: Payer: Self-pay

## 2019-05-20 ENCOUNTER — Encounter: Payer: Self-pay | Admitting: Family Medicine

## 2019-05-20 DIAGNOSIS — F1190 Opioid use, unspecified, uncomplicated: Secondary | ICD-10-CM | POA: Diagnosis not present

## 2019-05-20 DIAGNOSIS — G8929 Other chronic pain: Secondary | ICD-10-CM

## 2019-05-20 DIAGNOSIS — M5440 Lumbago with sciatica, unspecified side: Secondary | ICD-10-CM | POA: Diagnosis not present

## 2019-05-20 MED ORDER — ZOLPIDEM TARTRATE 10 MG PO TABS: 10.0000 mg | ORAL_TABLET | Freq: Every evening | ORAL | 5 refills | Status: DC | PRN

## 2019-05-20 MED ORDER — OXYCODONE-ACETAMINOPHEN 10-325 MG PO TABS: 1.0000 | ORAL_TABLET | Freq: Four times a day (QID) | ORAL | 0 refills | Status: DC | PRN

## 2019-05-20 MED ORDER — AMPHETAMINE-DEXTROAMPHETAMINE 20 MG PO TABS: 20.0000 mg | ORAL_TABLET | Freq: Every day | ORAL | 0 refills | Status: DC

## 2019-05-20 NOTE — Progress Notes (Signed)
   Subjective:    Patient ID: James Bradley, male    DOB: 1970/08/19, 49 y.o.   MRN: [id number]  HPI Here for pain management, he is doing well.  Indication for chronic opioid: low back pain Medication and dose: Percocet 10-325 # pills per month: 120 Last UDS date: 02-23-19 Opioid Treatment Agreement signed (Y/N): 04-19-17 Opioid Treatment Agreement last reviewed with patient:  05-20-19 N[facility] reviewed this encounter (include red flags): Yes Virtual Visit via Telephone Note  I connected with the patient on 05/20/19 at  9:30 AM EDT by telephone and verified that I am speaking with the correct person using two identifiers.   I discussed the limitations, risks, security and privacy concerns of performing an evaluation and management service by telephone and the availability of in person appointments. I also discussed with the patient that there may be a patient responsible charge related to this service. The patient expressed understanding and agreed to proceed.  Location patient: home Location provider: work or home office Participants present for the call: patient, provider Patient did not have a visit in the prior 7 days to address this/these issue(s).   History of Present Illness:    Observations/Objective: Patient sounds cheerful and well on the phone. I do not appreciate any SOB. Speech and thought processing are grossly intact. Patient reported vitals:  Assessment and Plan:   Follow Up Instructions:     [id number] 5-10 [id number] 21-30 I did not refer this patient for an OV in the next 24 hours for this/these issue(s).  I discussed the assessment and treatment plan with the patient. The patient was provided an opportunity to ask questions and all were answered. The patient agreed with the plan and demonstrated an understanding of the instructions.   The patient was advised to call back or seek an in-person evaluation if the symptoms worsen or if the condition fails  to improve as anticipated.  I provided 11 minutes of non-face-to-face time during this encounter.   Alysia Penna, MD    Review of Systems     Objective:   Physical Exam        Assessment & Plan:  Pain management, meds were refilled.  Alysia Penna, MD

## 2019-07-05 ENCOUNTER — Other Ambulatory Visit: Payer: Self-pay

## 2019-07-06 ENCOUNTER — Encounter: Payer: Self-pay | Admitting: Family Medicine

## 2019-07-06 ENCOUNTER — Ambulatory Visit: Payer: 59 | Admitting: Family Medicine

## 2019-07-06 VITALS — BP 110/62 | HR 99 | Temp 98.1°F | Wt 146.4 lb

## 2019-07-06 DIAGNOSIS — N3281 Overactive bladder: Secondary | ICD-10-CM

## 2019-07-06 MED ORDER — SOLIFENACIN SUCCINATE 5 MG PO TABS
5.0000 mg | ORAL_TABLET | Freq: Every day | ORAL | 3 refills | Status: DC
Start: 2019-07-06 — End: 2020-05-22

## 2019-07-06 NOTE — Progress Notes (Signed)
   Subjective:    Patient ID: Dagoberto Ligas, male    DOB: 1970-10-19, 49 y.o.   MRN: [id number]  HPI Here for follow up of OAB. He had been on Vesicare for this but stopped it. Lately he has had more trouble with urinary incontinence and he wants to get back on this. Also his intermittent FMLA needs to be renewed since occasional episodes of incontinence affect his ability to work.    Review of Systems  Constitutional: Negative.   Respiratory: Negative.   Cardiovascular: Negative.   Genitourinary: Positive for urgency.  Neurological: Negative.        Objective:   Physical Exam Constitutional:      Appearance: Normal appearance.  Cardiovascular:     Rate and Rhythm: Normal rate and regular rhythm.     Pulses: Normal pulses.     Heart sounds: Normal heart sounds.  Pulmonary:     Effort: Pulmonary effort is normal.     Breath sounds: Normal breath sounds.  Neurological:     Mental Status: He is alert.           Assessment & Plan:  OAB. Refilled Vesicare 5 mg daily. We worked on his FMLA forms together for him to submit to his work.  Alysia Penna, MD

## 2019-07-07 ENCOUNTER — Telehealth: Payer: Self-pay

## 2019-07-07 NOTE — Telephone Encounter (Signed)
Left a detailed message on verified voice mail informing the patient that his forms are ready to be picked up. Forms have been placed up front.

## 2019-07-08 NOTE — Telephone Encounter (Signed)
Forms have been picked up. Nothing further needed.

## 2019-07-13 ENCOUNTER — Ambulatory Visit (INDEPENDENT_AMBULATORY_CARE_PROVIDER_SITE_OTHER): Payer: 59 | Admitting: Family Medicine

## 2019-07-13 ENCOUNTER — Other Ambulatory Visit: Payer: Self-pay

## 2019-07-13 ENCOUNTER — Encounter: Payer: Self-pay | Admitting: Family Medicine

## 2019-07-13 VITALS — BP 122/64 | HR 97 | Temp 97.9°F | Wt 146.2 lb

## 2019-07-13 DIAGNOSIS — N3281 Overactive bladder: Secondary | ICD-10-CM

## 2019-07-13 NOTE — Progress Notes (Signed)
   Subjective:    Patient ID: James Bradley, male    DOB: November 26, 1970, 49 y.o.   MRN: [id number]  HPI Here for help with additional FMLA forms. He has OAB, and after his prostatectomy he has frequent urinary incontinence. Sometimes this occurs over and over in a 24 hour period, and this requires him to miss a day of work. We have already filled out forms for intermittent FMLA, but now his work requires [place] to document why he would need an entire day off work for this.    Review of Systems  Constitutional: Negative.   Respiratory: Negative.   Cardiovascular: Negative.        Objective:   Physical Exam Constitutional:      Appearance: Normal appearance.  Cardiovascular:     Rate and Rhythm: Normal rate and regular rhythm.     Pulses: Normal pulses.     Heart sounds: Normal heart sounds.  Pulmonary:     Effort: Pulmonary effort is normal.     Breath sounds: Normal breath sounds.  Neurological:     Mental Status: He is alert.           Assessment & Plan:  OAB with frequent incontinence. We will generate a letter to document his need for a whole day off work at times.  Alysia Penna, MD

## 2019-07-15 ENCOUNTER — Telehealth: Payer: Self-pay | Admitting: Family Medicine

## 2019-07-15 NOTE — Telephone Encounter (Signed)
Paper work has been completed and placed up front for pick up. The patient is aware.

## 2019-07-15 NOTE — Telephone Encounter (Signed)
Pt is calling in to pick up the additional pw that was given to Dr. Sarajane Jews on 07/13/2019.  Pt stated that he needs it back by today 07/15/2019.

## 2019-07-15 NOTE — Telephone Encounter (Signed)
The letter is ready for pickup  

## 2019-07-15 NOTE — Telephone Encounter (Signed)
This has been placed up front. The patient is aware.

## 2019-07-15 NOTE — Telephone Encounter (Signed)
Pt calling back regarding the additional page that was going to be ready today. Pt would like to know if it will be ready for pickup today?

## 2019-07-25 ENCOUNTER — Other Ambulatory Visit: Payer: Self-pay

## 2019-07-25 ENCOUNTER — Ambulatory Visit (INDEPENDENT_AMBULATORY_CARE_PROVIDER_SITE_OTHER): Payer: 59 | Admitting: Family Medicine

## 2019-07-25 ENCOUNTER — Encounter: Payer: Self-pay | Admitting: Family Medicine

## 2019-07-25 VITALS — BP 110/60 | HR 93 | Temp 98.8°F | Wt 143.4 lb

## 2019-07-25 DIAGNOSIS — N3281 Overactive bladder: Secondary | ICD-10-CM | POA: Diagnosis not present

## 2019-07-25 DIAGNOSIS — N3942 Incontinence without sensory awareness: Secondary | ICD-10-CM | POA: Diagnosis not present

## 2019-07-25 DIAGNOSIS — Z8546 Personal history of malignant neoplasm of prostate: Secondary | ICD-10-CM

## 2019-07-25 NOTE — Progress Notes (Signed)
   Subjective:    Patient ID: James Bradley, male    DOB: 1970/12/07, 49 y.o.   MRN: [id number]  HPI Here to discuss his FMLA application. 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 He says the Vesicare has helped a bit, but he still has a tough time.    Review of Systems  Constitutional: Negative.   Respiratory: Negative.   Cardiovascular: Negative.   Genitourinary: Positive for frequency.       Objective:   Physical Exam Constitutional:      Appearance: Normal appearance.  Cardiovascular:     Rate and Rhythm: Normal rate and regular rhythm.     Pulses: Normal pulses.     Heart sounds: Normal heart sounds.  Pulmonary:     Effort: Pulmonary effort is normal.     Breath sounds: Normal breath sounds.  Neurological:     Mental Status: He is alert.           Assessment & Plan:  OAB with incontinence. He will stay on Vesicare 5 mg daily. We will supply another more detailed letter to explain the nature of his problem. Alysia Penna, MD

## 2019-07-26 ENCOUNTER — Encounter: Payer: Self-pay | Admitting: Family Medicine

## 2019-07-26 NOTE — Telephone Encounter (Signed)
The letter is ready to pick up  °

## 2019-08-16 ENCOUNTER — Encounter: Payer: Self-pay | Admitting: Family Medicine

## 2019-08-16 ENCOUNTER — Other Ambulatory Visit: Payer: Self-pay

## 2019-08-16 ENCOUNTER — Ambulatory Visit: Payer: 59 | Admitting: Family Medicine

## 2019-08-16 VITALS — BP 120/70 | HR 72 | Wt 146.4 lb

## 2019-08-16 DIAGNOSIS — D171 Benign lipomatous neoplasm of skin and subcutaneous tissue of trunk: Secondary | ICD-10-CM | POA: Diagnosis not present

## 2019-08-16 NOTE — Progress Notes (Signed)
   Subjective:    Patient ID: James Bradley, male    DOB: 27-Jan-1970, 49 y.o.   MRN: [id number]  HPI Here to check a tender lump he discovered about 10 days ago on the right lower abdomen. This causes a slight discomfort but nothing too bad. He can work normally. No urinary or bowel issues.    Review of Systems  Constitutional: Negative.   Respiratory: Negative.   Cardiovascular: Negative.   Gastrointestinal: Negative.        Objective:   Physical Exam Constitutional:      Appearance: Normal appearance.  Cardiovascular:     Rate and Rhythm: Normal rate and regular rhythm.     Pulses: Normal pulses.     Heart sounds: Normal heart sounds.  Pulmonary:     Effort: Pulmonary effort is normal.     Breath sounds: Normal breath sounds.  Abdominal:     General: Abdomen is flat. Bowel sounds are normal. There is no distension.     Palpations: Abdomen is soft.     Tenderness: There is no guarding or rebound.     Hernia: No hernia is present.     Comments: There is a small firm mobile slightly tender lump between the skin and the muscle layer in the RLQ. This is about 4 cm from one of his laparotomy scars.   Neurological:     Mental Status: He is alert.           Assessment & Plan:  This is consistent with a lipoma. I reassured him that this is benign and that it does not relate to his prostate procedure at all. Recheck prn. Alysia Penna, MD

## 2019-08-19 ENCOUNTER — Telehealth (INDEPENDENT_AMBULATORY_CARE_PROVIDER_SITE_OTHER): Payer: 59 | Admitting: Family Medicine

## 2019-08-19 ENCOUNTER — Encounter: Payer: Self-pay | Admitting: Family Medicine

## 2019-08-19 DIAGNOSIS — G8929 Other chronic pain: Secondary | ICD-10-CM | POA: Diagnosis not present

## 2019-08-19 DIAGNOSIS — M5440 Lumbago with sciatica, unspecified side: Secondary | ICD-10-CM

## 2019-08-19 DIAGNOSIS — F1190 Opioid use, unspecified, uncomplicated: Secondary | ICD-10-CM | POA: Diagnosis not present

## 2019-08-19 MED ORDER — AMPHETAMINE-DEXTROAMPHETAMINE 20 MG PO TABS: 20.0000 mg | ORAL_TABLET | Freq: Every day | ORAL | 0 refills | Status: DC

## 2019-08-19 MED ORDER — OXYCODONE-ACETAMINOPHEN 10-325 MG PO TABS: 1.0000 | ORAL_TABLET | Freq: Four times a day (QID) | ORAL | 0 refills | Status: DC | PRN

## 2019-08-19 NOTE — Progress Notes (Signed)
Subjective:    Patient ID: James Bradley, male    DOB: [birth year], 49 y.o.   MRN: [id number]  HPI Virtual Visit via Video Note  I connected with the patient on 08/19/19 at  1:00 PM EDT by a video enabled telemedicine application and verified that I am speaking with the correct person using two identifiers.  Location patient: home Location provider:work or home office Persons participating in the virtual visit: patient, provider  I discussed the limitations of evaluation and management by telemedicine and the availability of in person appointments. The patient expressed understanding and agreed to proceed.   HPI: Her for pain management, he is doing well.  Indication for chronic opioid: low back pain Medication and dose: Percocet 10-325 # pills per month: 120 Last UDS date: 02-23-19 Opioid Treatment Agreement signed (Y/N): 04-19-17 Opioid Treatment Agreement last reviewed with patient:  08-19-19 N[facility]SRS reviewed this encounter (include red flags): Yes    ROS: See pertinent positives and negatives per HPI.  Past Medical History:  Diagnosis Date  . Anxiety   . Cancer [facility])    prostate , sees Dr. Jeffie Pollock   . GERD (gastroesophageal reflux disease)   . Herpes labialis   . Insomnia   . Low back pain   . Onychomycosis   . Prostatitis     Past Surgical History:  Procedure Laterality Date  . COLONOSCOPY  August 2013    per Dr. Carol Ada , clear, repeat in 10 yrs   . ESI Dr Lorin Mercy    . microdiskectomy  2009   at L5-S1, per Dr. Kerby Nora   . PROSTATECTOMY  08-20-10   per Dr. Jeffie Pollock    Family History  Problem Relation Age of Onset  . Arthritis Other   . Diabetes Other      Current Outpatient Medications:  .  ALPRAZolam (XANAX) 1 MG tablet, Take 1 tablet (1 mg total) by mouth 3 (three) times daily as needed. for anxiety, Disp: 90 tablet, Rfl: 5 .  [START ON 10/19/2019] amphetamine-dextroamphetamine (ADDERALL) 20 MG tablet, Take 1 tablet (20 mg total) by mouth daily.,  Disp: 30 tablet, Rfl: 0 .  [START ON 10/19/2019] oxyCODONE-acetaminophen (PERCOCET) 10-325 MG tablet, Take 1 tablet by mouth every 6 (six) hours as needed for pain., Disp: 120 tablet, Rfl: 0 .  solifenacin (VESICARE) 5 MG tablet, Take 1 tablet (5 mg total) by mouth daily., Disp: 90 tablet, Rfl: 3 .  terbinafine (LAMISIL) 250 MG tablet, TAKE 1 TABLET BY MOUTH EVERY DAY, Disp: 90 tablet, Rfl: 1 .  valACYclovir (VALTREX) 500 MG tablet, Take 1 tablet (500 mg total) by mouth 2 (two) times daily as needed (cold sores)., Disp: 60 tablet, Rfl: 11 .  zolpidem (AMBIEN) 10 MG tablet, Take 1 tablet (10 mg total) by mouth at bedtime as needed. for sleep, Disp: 30 tablet, Rfl: 5  EXAM:  VITALS per patient if applicable:  GENERAL: alert, oriented, appears well and in no acute distress  HEENT: atraumatic, conjunttiva clear, no obvious abnormalities on inspection of external nose and ears  NECK: normal movements of the head and neck  LUNGS: on inspection no signs of respiratory distress, breathing rate appears normal, no obvious gross SOB, gasping or wheezing  CV: no obvious cyanosis  MS: moves all visible extremities without noticeable abnormality  PSYCH/NEURO: pleasant and cooperative, no obvious depression or anxiety, speech and thought processing grossly intact  ASSESSMENT AND PLAN: Pain management, meds were refilled. Alysia Penna, MD  Discussed the following assessment  and plan:  No diagnosis found.     I discussed the assessment and treatment plan with the patient. The patient was provided an opportunity to ask questions and all were answered. The patient agreed with the plan and demonstrated an understanding of the instructions.   The patient was advised to call back or seek an in-person evaluation if the symptoms worsen or if the condition fails to improve as anticipated.     Review of Systems     Objective:   Physical Exam        Assessment & Plan:

## 2019-09-14 ENCOUNTER — Other Ambulatory Visit: Payer: Self-pay

## 2019-09-15 ENCOUNTER — Encounter: Payer: Self-pay | Admitting: Family Medicine

## 2019-09-15 ENCOUNTER — Telehealth (INDEPENDENT_AMBULATORY_CARE_PROVIDER_SITE_OTHER): Payer: 59 | Admitting: Family Medicine

## 2019-09-15 DIAGNOSIS — F418 Other specified anxiety disorders: Secondary | ICD-10-CM | POA: Diagnosis not present

## 2019-09-15 MED ORDER — ESCITALOPRAM OXALATE 10 MG PO TABS
10.0000 mg | ORAL_TABLET | Freq: Every day | ORAL | 2 refills | Status: DC
Start: 2019-09-15 — End: 2019-10-10

## 2019-09-15 MED ORDER — ALPRAZOLAM 1 MG PO TABS: 1.0000 mg | ORAL_TABLET | Freq: Three times a day (TID) | ORAL | 5 refills | Status: DC | PRN

## 2019-09-15 NOTE — Progress Notes (Signed)
Subjective:    Patient ID: James Bradley, male    DOB: 1970/10/03, 49 y.o.   MRN: [id number]  HPI Virtual Visit via Video Note  I connected with the patient on 09/15/19 at  8:45 AM EDT by a video enabled telemedicine application and verified that I am speaking with the correct person using two identifiers.  Location patient: home Location provider:work or home office Persons participating in the virtual visit: patient, provider  I discussed the limitations of evaluation and management by telemedicine and the availability of in person appointments. The patient expressed understanding and agreed to proceed.   HPI: Here asking for help with anxiety and depression. He takes Xanax as needed, but the stress of his job is getting worse. He says he takes it out on his family and he wants to stop doing this. He has asked his job for time off, and he will bring [place] some FMLA papers to sign. He had taken Prozac for awhile in 2018, but this was too strong for him.    ROS: See pertinent positives and negatives per HPI.  Past Medical History:  Diagnosis Date  . Anxiety   . Cancer [facility])    prostate , sees Dr. Jeffie Pollock   . GERD (gastroesophageal reflux disease)   . Herpes labialis   . Insomnia   . Low back pain   . Onychomycosis   . Prostatitis     Past Surgical History:  Procedure Laterality Date  . COLONOSCOPY  August 2013    per Dr. Carol Ada , clear, repeat in 10 yrs   . ESI Dr Lorin Mercy    . microdiskectomy  2009   at L5-S1, per Dr. Kerby Nora   . PROSTATECTOMY  08-20-10   per Dr. Jeffie Pollock    Family History  Problem Relation Age of Onset  . Arthritis Other   . Diabetes Other      Current Outpatient Medications:  .  ALPRAZolam (XANAX) 1 MG tablet, Take 1 tablet (1 mg total) by mouth 3 (three) times daily as needed. for anxiety, Disp: 90 tablet, Rfl: 5 .  [START ON 10/19/2019] amphetamine-dextroamphetamine (ADDERALL) 20 MG tablet, Take 1 tablet (20 mg total) by mouth daily., Disp:  30 tablet, Rfl: 0 .  [START ON 10/19/2019] oxyCODONE-acetaminophen (PERCOCET) 10-325 MG tablet, Take 1 tablet by mouth every 6 (six) hours as needed for pain., Disp: 120 tablet, Rfl: 0 .  solifenacin (VESICARE) 5 MG tablet, Take 1 tablet (5 mg total) by mouth daily., Disp: 90 tablet, Rfl: 3 .  terbinafine (LAMISIL) 250 MG tablet, TAKE 1 TABLET BY MOUTH EVERY DAY, Disp: 90 tablet, Rfl: 1 .  valACYclovir (VALTREX) 500 MG tablet, Take 1 tablet (500 mg total) by mouth 2 (two) times daily as needed (cold sores)., Disp: 60 tablet, Rfl: 11 .  zolpidem (AMBIEN) 10 MG tablet, Take 1 tablet (10 mg total) by mouth at bedtime as needed. for sleep, Disp: 30 tablet, Rfl: 5  EXAM:  VITALS per patient if applicable:  GENERAL: alert, oriented, appears well and in no acute distress  HEENT: atraumatic, conjunttiva clear, no obvious abnormalities on inspection of external nose and ears  NECK: normal movements of the head and neck  LUNGS: on inspection no signs of respiratory distress, breathing rate appears normal, no obvious gross SOB, gasping or wheezing  CV: no obvious cyanosis  MS: moves all visible extremities without noticeable abnormality  PSYCH/NEURO: pleasant and cooperative, no obvious depression or anxiety, speech and thought processing grossly  intact  ASSESSMENT AND PLAN: Depression with anxiety. He will try Lexapro 10 mg daily and then add Xanax as needed. He will see [place] tomorrow to fill out paperwork.  Alysia Penna, MD  Discussed the following assessment and plan:  No diagnosis found.     I discussed the assessment and treatment plan with the patient. The patient was provided an opportunity to ask questions and all were answered. The patient agreed with the plan and demonstrated an understanding of the instructions.   The patient was advised to call back or seek an in-person evaluation if the symptoms worsen or if the condition fails to improve as anticipated.     Review of  Systems     Objective:   Physical Exam        Assessment & Plan:

## 2019-09-16 ENCOUNTER — Other Ambulatory Visit: Payer: Self-pay

## 2019-09-16 ENCOUNTER — Encounter: Payer: Self-pay | Admitting: Family Medicine

## 2019-09-16 ENCOUNTER — Ambulatory Visit (INDEPENDENT_AMBULATORY_CARE_PROVIDER_SITE_OTHER): Payer: 59 | Admitting: Family Medicine

## 2019-09-16 VITALS — BP 120/62 | HR 112 | Temp 98.5°F | Wt 143.6 lb

## 2019-09-16 DIAGNOSIS — N3942 Incontinence without sensory awareness: Secondary | ICD-10-CM | POA: Diagnosis not present

## 2019-09-16 DIAGNOSIS — F418 Other specified anxiety disorders: Secondary | ICD-10-CM

## 2019-09-16 DIAGNOSIS — N3281 Overactive bladder: Secondary | ICD-10-CM

## 2019-09-16 DIAGNOSIS — Z23 Encounter for immunization: Secondary | ICD-10-CM

## 2019-09-16 NOTE — Progress Notes (Signed)
° °  Subjective:    Patient ID: James Bradley, male    DOB: [birth year], 49 y.o.   MRN: [id number]  HPI Here to discuss short term disability. He has been dealing with urinary urgency and incontinence for several years, and now this has been compounded by his depression and anxiety. He has been struggling with depression and anxiety for some months now, and he feels is no longer able to work. We had a virtual visit about this yesterday, and we started him on Lexapro for this. He picked this up today from the pharmacy, and he plans to start taking this tonight. He has feelings of sadness and hopelessness, and he gets so anxious at times that he has trouble thinking clearly and his hands shake. He has had some visual hallucinations as well, which he describes as "seeing shadows" go down the hall. He has not actually seen people as yet. He denies any suicidal or homicidal thoughts, but his wife was so worried about his safety that she locked up the guns he had in the house. He brings some paperwork from his job for [place] to fill out. His first day of missing work was 09-12-19.    Review of Systems  Constitutional: Negative.   Respiratory: Negative.   Cardiovascular: Negative.   Psychiatric/Behavioral: Positive for agitation, confusion, decreased concentration, dysphoric mood, hallucinations and sleep disturbance. Negative for behavioral problems, self-injury and suicidal ideas. The patient is nervous/anxious.        Objective:   Physical Exam Constitutional:      General: He is not in acute distress.    Appearance: Normal appearance.  Cardiovascular:     Rate and Rhythm: Normal rate and regular rhythm.     Pulses: Normal pulses.     Heart sounds: Normal heart sounds.  Pulmonary:     Effort: Pulmonary effort is normal.     Breath sounds: Normal breath sounds.  Neurological:     General: No focal deficit present.     Mental Status: He is alert and oriented to person, place, and time. Mental  status is at baseline.  Psychiatric:        Behavior: Behavior normal.        Thought Content: Thought content normal.        Judgment: Judgment normal.     Comments: He is somewhat anxious today            Assessment & Plan:  He is dealing with a combination of depression, anxiety, OAB, and urinary incontinence which is overwhelming. We will write him out of work from 09-12-19 until 10-10-19. Hopefully the Lexapro will be effective soon. I also urged him to see a therapist and he agreed. I gave him information about contacting [place] for this. We plan to meet again in 2 weeks.  Alysia Penna, MD

## 2019-09-22 ENCOUNTER — Other Ambulatory Visit: Payer: Self-pay

## 2019-09-23 ENCOUNTER — Ambulatory Visit: Payer: 59 | Admitting: Family Medicine

## 2019-09-23 ENCOUNTER — Encounter: Payer: Self-pay | Admitting: Family Medicine

## 2019-09-23 VITALS — BP 120/80 | HR 88 | Temp 99.0°F | Wt 146.6 lb

## 2019-09-23 DIAGNOSIS — F418 Other specified anxiety disorders: Secondary | ICD-10-CM | POA: Diagnosis not present

## 2019-09-23 NOTE — Progress Notes (Signed)
   Subjective:    Patient ID: James Bradley, male    DOB: Nov 20, 1970, 49 y.o.   MRN: [id number]  HPI Here to follow up on depression and anxiety. He is feeling better, and he thinks the Lexapro will be helpful for him. He is in the process of setting up some psychotherapy. He is going through the process of applying for disability.     Review of Systems  Constitutional: Negative.   Respiratory: Negative.   Cardiovascular: Negative.   Psychiatric/Behavioral: Positive for dysphoric mood. Negative for agitation, behavioral problems, confusion and decreased concentration. The patient is nervous/anxious.        Objective:   Physical Exam Constitutional:      Appearance: Normal appearance.  Cardiovascular:     Rate and Rhythm: Normal rate and regular rhythm.     Pulses: Normal pulses.     Heart sounds: Normal heart sounds.  Pulmonary:     Effort: Pulmonary effort is normal.     Breath sounds: Normal breath sounds.  Neurological:     Mental Status: He is alert.  Psychiatric:        Mood and Affect: Mood normal.        Behavior: Behavior normal.        Thought Content: Thought content normal.        Judgment: Judgment normal.           Assessment & Plan:  Depression and anxiety. We will follow up in 2 weeks.  Alysia Penna, MD

## 2019-10-08 ENCOUNTER — Other Ambulatory Visit: Payer: Self-pay | Admitting: Family Medicine

## 2019-11-14 ENCOUNTER — Encounter: Payer: Self-pay | Admitting: Family Medicine

## 2019-11-14 ENCOUNTER — Telehealth (INDEPENDENT_AMBULATORY_CARE_PROVIDER_SITE_OTHER): Payer: 59 | Admitting: Family Medicine

## 2019-11-14 VITALS — Temp 97.3°F | Wt 140.0 lb

## 2019-11-14 DIAGNOSIS — F1190 Opioid use, unspecified, uncomplicated: Secondary | ICD-10-CM

## 2019-11-14 DIAGNOSIS — M5440 Lumbago with sciatica, unspecified side: Secondary | ICD-10-CM

## 2019-11-14 DIAGNOSIS — G8929 Other chronic pain: Secondary | ICD-10-CM | POA: Diagnosis not present

## 2019-11-14 MED ORDER — OXYCODONE-ACETAMINOPHEN 10-325 MG PO TABS: 1.0000 | ORAL_TABLET | Freq: Four times a day (QID) | ORAL | 0 refills | Status: DC | PRN

## 2019-11-14 MED ORDER — OXYCODONE-ACETAMINOPHEN 10-325 MG PO TABS
1.0000 | ORAL_TABLET | Freq: Four times a day (QID) | ORAL | 0 refills | Status: DC | PRN
Start: 2020-01-18 — End: 2020-02-09

## 2019-11-14 NOTE — Progress Notes (Signed)
Subjective:    Patient ID: James Bradley, male    DOB: 27-Aug-1970, 50 y.o.   MRN: [id number]  HPI Virtual Visit via Video Note  I connected with the patient on 11/14/19 at  9:15 AM EDT by a video enabled telemedicine application and verified that I am speaking with the correct person using two identifiers.  Location patient: home Location provider:work or home office Persons participating in the virtual visit: patient, provider  I discussed the limitations of evaluation and management by telemedicine and the availability of in person appointments. The patient expressed understanding and agreed to proceed.   HPI: Here for pain management, he is doing well.  Indication for chronic opioid: low back pain Medication and dose: Percocet 10-325 # pills per month: 120 Last UDS date: 02-23-19 Opioid Treatment Agreement signed (Y/N): 04-19-17 Opioid Treatment Agreement last reviewed with patient:  11-14-19 NCCSRS reviewed this encounter (include red flags): Yes    ROS: See pertinent positives and negatives per HPI.  Past Medical History:  Diagnosis Date  . Anxiety   . Cancer [facility])    prostate , sees Dr. Jeffie Pollock   . GERD (gastroesophageal reflux disease)   . Herpes labialis   . Insomnia   . Low back pain   . Onychomycosis   . Prostatitis     Past Surgical History:  Procedure Laterality Date  . COLONOSCOPY  August 2013    per Dr. Carol Ada , clear, repeat in 10 yrs   . ESI Dr Lorin Mercy    . microdiskectomy  2009   at L5-S1, per Dr. Kerby Nora   . PROSTATECTOMY  08-20-10   per Dr. Jeffie Pollock    Family History  Problem Relation Age of Onset  . Arthritis Other   . Diabetes Other      Current Outpatient Medications:  .  ALPRAZolam (XANAX) 1 MG tablet, Take 1 tablet (1 mg total) by mouth 3 (three) times daily as needed. for anxiety, Disp: 90 tablet, Rfl: 5 .  amphetamine-dextroamphetamine (ADDERALL) 20 MG tablet, Take 1 tablet (20 mg total) by mouth daily., Disp: 30 tablet, Rfl:  0 .  escitalopram (LEXAPRO) 10 MG tablet, TAKE 1 TABLET BY MOUTH EVERY DAY, Disp: 90 tablet, Rfl: 0 .  [START ON 12/18/2019] oxyCODONE-acetaminophen (PERCOCET) 10-325 MG tablet, Take 1 tablet by mouth every 6 (six) hours as needed for pain., Disp: 120 tablet, Rfl: 0 .  solifenacin (VESICARE) 5 MG tablet, Take 1 tablet (5 mg total) by mouth daily., Disp: 90 tablet, Rfl: 3 .  terbinafine (LAMISIL) 250 MG tablet, TAKE 1 TABLET BY MOUTH EVERY DAY, Disp: 90 tablet, Rfl: 1 .  valACYclovir (VALTREX) 500 MG tablet, Take 1 tablet (500 mg total) by mouth 2 (two) times daily as needed (cold sores)., Disp: 60 tablet, Rfl: 11 .  venlafaxine XR (EFFEXOR-XR) 150 MG 24 hr capsule, Take 150 mg by mouth daily., Disp: , Rfl:  .  zolpidem (AMBIEN) 10 MG tablet, Take 1 tablet (10 mg total) by mouth at bedtime as needed. for sleep, Disp: 30 tablet, Rfl: 5  EXAM:  VITALS per patient if applicable:  GENERAL: alert, oriented, appears well and in no acute distress  HEENT: atraumatic, conjunttiva clear, no obvious abnormalities on inspection of external nose and ears  NECK: normal movements of the head and neck  LUNGS: on inspection no signs of respiratory distress, breathing rate appears normal, no obvious gross SOB, gasping or wheezing  CV: no obvious cyanosis  MS: moves all visible extremities  without noticeable abnormality  PSYCH/NEURO: pleasant and cooperative, no obvious depression or anxiety, speech and thought processing grossly intact  ASSESSMENT AND PLAN: Pain management, meds were refilled.  Alysia Penna, MD  Discussed the following assessment and plan:  No diagnosis found.     I discussed the assessment and treatment plan with the patient. The patient was provided an opportunity to ask questions and all were answered. The patient agreed with the plan and demonstrated an understanding of the instructions.   The patient was advised to call back or seek an in-person evaluation if the symptoms  worsen or if the condition fails to improve as anticipated.     Review of Systems     Objective:   Physical Exam        Assessment & Plan:

## 2019-11-16 ENCOUNTER — Other Ambulatory Visit: Payer: Self-pay | Admitting: Family Medicine

## 2019-11-16 ENCOUNTER — Ambulatory Visit: Payer: 59 | Admitting: Family Medicine

## 2019-11-20 ENCOUNTER — Other Ambulatory Visit: Payer: Self-pay | Admitting: Family Medicine

## 2019-11-22 ENCOUNTER — Other Ambulatory Visit: Payer: Self-pay | Admitting: Family Medicine

## 2019-11-24 MED ORDER — AMPHETAMINE-DEXTROAMPHETAMINE 20 MG PO TABS: 20.0000 mg | ORAL_TABLET | Freq: Every day | ORAL | 0 refills | Status: DC

## 2019-11-24 NOTE — Telephone Encounter (Signed)
Done

## 2020-01-16 ENCOUNTER — Telehealth: Payer: Self-pay | Admitting: Family Medicine

## 2020-01-16 NOTE — Telephone Encounter (Signed)
Please review message and advise.   Thanks. Dm/cma  

## 2020-01-16 NOTE — Telephone Encounter (Signed)
Pt is calling in to see if someone would approve he to get his oxycodone -acetaminophen (PERCOCET) 10-325 MG before 01/18/2020 due to him having a accident fell off his ladder taking down christmas decorations and is in a lot of pain.  Pharm:  [facility]

## 2020-01-18 NOTE — Telephone Encounter (Signed)
I just saw this message. Of course he can get this filled today

## 2020-01-18 NOTE — Telephone Encounter (Signed)
Called patient to inform that Rx was sent to requested pharmacy. No answer Lm informing patient that medication should be ready for pick up now.

## 2020-02-09 ENCOUNTER — Encounter: Payer: Self-pay | Admitting: Family Medicine

## 2020-02-09 ENCOUNTER — Other Ambulatory Visit: Payer: Self-pay

## 2020-02-09 ENCOUNTER — Ambulatory Visit: Payer: 59 | Admitting: Family Medicine

## 2020-02-09 VITALS — BP 130/84 | HR 88 | Ht 65.0 in | Wt 150.0 lb

## 2020-02-09 DIAGNOSIS — G8929 Other chronic pain: Secondary | ICD-10-CM | POA: Diagnosis not present

## 2020-02-09 DIAGNOSIS — F1190 Opioid use, unspecified, uncomplicated: Secondary | ICD-10-CM | POA: Diagnosis not present

## 2020-02-09 DIAGNOSIS — Z23 Encounter for immunization: Secondary | ICD-10-CM | POA: Diagnosis not present

## 2020-02-09 DIAGNOSIS — M5440 Lumbago with sciatica, unspecified side: Secondary | ICD-10-CM

## 2020-02-09 DIAGNOSIS — R195 Other fecal abnormalities: Secondary | ICD-10-CM | POA: Insufficient documentation

## 2020-02-09 DIAGNOSIS — R1013 Epigastric pain: Secondary | ICD-10-CM | POA: Insufficient documentation

## 2020-02-09 DIAGNOSIS — R197 Diarrhea, unspecified: Secondary | ICD-10-CM | POA: Insufficient documentation

## 2020-02-09 MED ORDER — OXYCODONE-ACETAMINOPHEN 10-325 MG PO TABS
1.0000 | ORAL_TABLET | Freq: Four times a day (QID) | ORAL | 0 refills | Status: AC | PRN
Start: 2020-04-09 — End: 2020-05-09

## 2020-02-09 MED ORDER — OXYCODONE-ACETAMINOPHEN 10-325 MG PO TABS
1.0000 | ORAL_TABLET | Freq: Four times a day (QID) | ORAL | 0 refills | Status: DC | PRN
Start: 2020-02-13 — End: 2020-02-09

## 2020-02-09 MED ORDER — OXYCODONE-ACETAMINOPHEN 10-325 MG PO TABS
1.0000 | ORAL_TABLET | Freq: Four times a day (QID) | ORAL | 0 refills | Status: DC | PRN
Start: 2020-03-12 — End: 2020-02-09

## 2020-02-09 MED ORDER — CYCLOBENZAPRINE HCL 10 MG PO TABS: 10.0000 mg | ORAL_TABLET | Freq: Three times a day (TID) | ORAL | 5 refills | Status: DC | PRN

## 2020-02-09 NOTE — Progress Notes (Signed)
   Subjective:    Patient ID: James Bradley, male    DOB: 1970/04/24, 50 y.o.   MRN: [id number]  HPI Here for pain management. He is doing well.  Indication for chronic opioid: low back pain Medication and dose: Percocet 10-325 # pills per month: 120 Last UDS date: 02-23-19 Opioid Treatment Agreement signed (Y/N): 04-19-17 Opioid Treatment Agreement last reviewed with patient:  02-09-20 NCCSRS reviewed this encounter (include red flags): Yes    Review of Systems     Objective:   Physical Exam        Assessment & Plan:  Pain management, meds were refilled.  Alysia Penna, MD

## 2020-02-12 LAB — DRUG MONITOR, PANEL 1, W/CONF, URINE
Alphahydroxyalprazolam: 307 ng/mL — ABNORMAL HIGH (ref <25)
Alphahydroxymidazolam: NEGATIVE ng/mL (ref <50)
Alphahydroxytriazolam: NEGATIVE ng/mL (ref <50)
Aminoclonazepam: NEGATIVE ng/mL (ref <25)
Amphetamines: NEGATIVE ng/mL (ref <500)
Barbiturates: NEGATIVE ng/mL (ref <300)
Benzodiazepines: POSITIVE ng/mL — AB (ref <100)
Cocaine Metabolite: NEGATIVE ng/mL (ref <150)
Codeine: NEGATIVE ng/mL (ref <50)
Creatinine: 115.9 mg/dL
Hydrocodone: NEGATIVE ng/mL (ref <50)
Hydromorphone: NEGATIVE ng/mL (ref <50)
Hydroxyethylflurazepam: NEGATIVE ng/mL (ref <50)
Lorazepam: NEGATIVE ng/mL (ref <50)
Marijuana Metabolite: NEGATIVE ng/mL (ref <20)
Methadone Metabolite: NEGATIVE ng/mL (ref <100)
Morphine: NEGATIVE ng/mL (ref <50)
Nordiazepam: NEGATIVE ng/mL (ref <50)
Norhydrocodone: NEGATIVE ng/mL (ref <50)
Noroxycodone: 3717 ng/mL — ABNORMAL HIGH (ref <50)
Opiates: NEGATIVE ng/mL (ref <100)
Oxazepam: NEGATIVE ng/mL (ref <50)
Oxidant: NEGATIVE ug/mL
Oxycodone: 1393 ng/mL — ABNORMAL HIGH (ref <50)
Oxycodone: POSITIVE ng/mL — AB (ref <100)
Oxymorphone: 786 ng/mL — ABNORMAL HIGH (ref <50)
Phencyclidine: NEGATIVE ng/mL (ref <25)
Temazepam: NEGATIVE ng/mL (ref <50)
pH: 7.4 (ref 4.5–9.0)

## 2020-02-12 LAB — DM TEMPLATE

## 2020-03-10 ENCOUNTER — Other Ambulatory Visit: Payer: Self-pay | Admitting: Family Medicine

## 2020-03-22 ENCOUNTER — Encounter: Payer: Self-pay | Admitting: Family Medicine

## 2020-03-27 ENCOUNTER — Ambulatory Visit: Payer: Self-pay | Admitting: Family Medicine

## 2020-04-14 ENCOUNTER — Other Ambulatory Visit: Payer: Self-pay | Admitting: Family Medicine

## 2020-04-16 NOTE — Telephone Encounter (Signed)
Last office visit- 02/09/2020 Last refill- 09/15/2019-90 tabs with 5 refills  No future refill has been scheduled

## 2020-05-15 ENCOUNTER — Encounter: Payer: Self-pay | Admitting: Family Medicine

## 2020-05-15 ENCOUNTER — Telehealth (INDEPENDENT_AMBULATORY_CARE_PROVIDER_SITE_OTHER): Payer: Medicaid Other | Admitting: Family Medicine

## 2020-05-15 DIAGNOSIS — G8929 Other chronic pain: Secondary | ICD-10-CM

## 2020-05-15 DIAGNOSIS — M5440 Lumbago with sciatica, unspecified side: Secondary | ICD-10-CM

## 2020-05-15 DIAGNOSIS — F1190 Opioid use, unspecified, uncomplicated: Secondary | ICD-10-CM

## 2020-05-15 MED ORDER — OXYCODONE-ACETAMINOPHEN 10-325 MG PO TABS: 1.0000 | ORAL_TABLET | Freq: Four times a day (QID) | ORAL | 0 refills | Status: DC | PRN

## 2020-05-15 MED ORDER — AMPHETAMINE-DEXTROAMPHETAMINE 20 MG PO TABS: 20.0000 mg | ORAL_TABLET | Freq: Every day | ORAL | 0 refills | Status: DC

## 2020-05-15 MED ORDER — ZOLPIDEM TARTRATE 10 MG PO TABS: 10.0000 mg | ORAL_TABLET | Freq: Every evening | ORAL | 5 refills | Status: DC | PRN

## 2020-05-15 NOTE — Progress Notes (Signed)
   Subjective:    Patient ID: James Bradley, male    DOB: 05/14/1970, 50 y.o.   MRN: [id number]  HPI Virtual Visit via Telephone Note  I connected with the patient on 05/15/20 at  1:30 PM EDT by telephone and verified that I am speaking with the correct person using two identifiers.   I discussed the limitations, risks, security and privacy concerns of performing an evaluation and management service by telephone and the availability of in person appointments. I also discussed with the patient that there may be a patient responsible charge related to this service. The patient expressed understanding and agreed to proceed.  Location patient: home Location provider: work or home office Participants present for the call: patient, provider Patient did not have a visit in the prior 7 days to address this/these issue(s).   History of Present Illness: Here for pain management, he is doing well.   Observations/Objective: Patient sounds cheerful and well on the phone. I do not appreciate any SOB. Speech and thought processing are grossly intact. Patient reported vitals:  Assessment and Plan: Pain management. Indication for chronic opioid: low back pain Medication and dose: Percocet 10-325 # pills per month: 120 Last UDS date: 02-09-20 Opioid Treatment Agreement signed (Y/N): 04-19-17 Opioid Treatment Agreement last reviewed with patient:  05-15-20 NCCSRS reviewed this encounter (include red flags): Yes Meds were refilled.  Alysia Penna, MD   Follow Up Instructions:     [phone] 5-10 [phone] 11-20 [id number] 21-30 I did not refer this patient for an OV in the next 24 hours for this/these issue(s).  I discussed the assessment and treatment plan with the patient. The patient was provided an opportunity to ask questions and all were answered. The patient agreed with the plan and demonstrated an understanding of the instructions.   The patient was advised to call back or seek an in-person  evaluation if the symptoms worsen or if the condition fails to improve as anticipated.  I provided 18 minutes of non-face-to-face time during this encounter.   Alysia Penna, MD    Review of Systems     Objective:   Physical Exam        Assessment & Plan:

## 2020-05-21 ENCOUNTER — Other Ambulatory Visit: Payer: Self-pay

## 2020-05-22 ENCOUNTER — Ambulatory Visit (INDEPENDENT_AMBULATORY_CARE_PROVIDER_SITE_OTHER): Payer: 59 | Admitting: Family Medicine

## 2020-05-22 ENCOUNTER — Encounter: Payer: Self-pay | Admitting: Family Medicine

## 2020-05-22 VITALS — BP 118/80 | HR 66 | Temp 98.3°F | Ht <= 58 in | Wt 162.0 lb

## 2020-05-22 DIAGNOSIS — Z0000 Encounter for general adult medical examination without abnormal findings: Secondary | ICD-10-CM | POA: Diagnosis not present

## 2020-05-22 DIAGNOSIS — Z125 Encounter for screening for malignant neoplasm of prostate: Secondary | ICD-10-CM

## 2020-05-22 LAB — PSA: PSA: 0.04 ng/mL — ABNORMAL LOW (ref 0.10–4.00)

## 2020-05-22 LAB — HEPATIC FUNCTION PANEL
ALT: 30 U/L (ref 0–53)
AST: 34 U/L (ref 0–37)
Albumin: 4.7 g/dL (ref 3.5–5.2)
Alkaline Phosphatase: 63 U/L (ref 39–117)
Bilirubin, Direct: 0.2 mg/dL (ref 0.0–0.3)
Total Bilirubin: 0.9 mg/dL (ref 0.2–1.2)
Total Protein: 7.2 g/dL (ref 6.0–8.3)

## 2020-05-22 LAB — CBC WITH DIFFERENTIAL/PLATELET
Basophils Absolute: 0 10*3/uL (ref 0.0–0.1)
Basophils Relative: 0.7 % (ref 0.0–3.0)
Eosinophils Absolute: 0.1 10*3/uL (ref 0.0–0.7)
Eosinophils Relative: 1.6 % (ref 0.0–5.0)
HCT: 39.7 % (ref 39.0–52.0)
Hemoglobin: 13 g/dL (ref 13.0–17.0)
Lymphocytes Relative: 46.5 % — ABNORMAL HIGH (ref 12.0–46.0)
Lymphs Abs: 2.3 10*3/uL (ref 0.7–4.0)
MCHC: 32.8 g/dL (ref 30.0–36.0)
MCV: 71.9 fl — ABNORMAL LOW (ref 78.0–100.0)
Monocytes Absolute: 0.4 10*3/uL (ref 0.1–1.0)
Monocytes Relative: 8.2 % (ref 3.0–12.0)
Neutro Abs: 2.2 10*3/uL (ref 1.4–7.7)
Neutrophils Relative %: 43 % (ref 43.0–77.0)
Platelets: 302 10*3/uL (ref 150.0–400.0)
RBC: 5.53 Mil/uL (ref 4.22–5.81)
RDW: 16.5 % — ABNORMAL HIGH (ref 11.5–15.5)
WBC: 5.1 10*3/uL (ref 4.0–10.5)

## 2020-05-22 LAB — LIPID PANEL
Cholesterol: 178 mg/dL (ref 0–200)
HDL: 79.7 mg/dL (ref >39.00)
LDL Cholesterol: 76 mg/dL (ref 0–99)
NonHDL: 98.78
Total CHOL/HDL Ratio: 2
Triglycerides: 113 mg/dL (ref 0.0–149.0)
VLDL: 22.6 mg/dL (ref 0.0–40.0)

## 2020-05-22 LAB — BASIC METABOLIC PANEL
BUN: 18 mg/dL (ref 6–23)
CO2: 28 mEq/L (ref 19–32)
Calcium: 9.5 mg/dL (ref 8.4–10.5)
Chloride: 103 mEq/L (ref 96–112)
Creatinine, Ser: 0.89 mg/dL (ref 0.40–1.50)
GFR: 99.29 mL/min (ref >60.00)
Glucose, Bld: 105 mg/dL — ABNORMAL HIGH (ref 70–99)
Potassium: 4.4 mEq/L (ref 3.5–5.1)
Sodium: 139 mEq/L (ref 135–145)

## 2020-05-22 LAB — T4, FREE: Free T4: 0.46 ng/dL — ABNORMAL LOW (ref 0.60–1.60)

## 2020-05-22 LAB — T3, FREE: T3, Free: 3 pg/mL (ref 2.3–4.2)

## 2020-05-22 LAB — HEMOGLOBIN A1C: Hgb A1c MFr Bld: 5.9 % (ref 4.6–6.5)

## 2020-05-22 LAB — TSH: TSH: 0.3 u[IU]/mL — ABNORMAL LOW (ref 0.35–4.50)

## 2020-05-22 MED ORDER — ESCITALOPRAM OXALATE 10 MG PO TABS: 1.0000 | ORAL_TABLET | Freq: Every day | ORAL | 3 refills | Status: DC

## 2020-05-22 MED ORDER — SOLIFENACIN SUCCINATE 5 MG PO TABS: 5.0000 mg | ORAL_TABLET | Freq: Every day | ORAL | 3 refills | Status: DC

## 2020-05-22 NOTE — Progress Notes (Signed)
   Subjective:    Patient ID: James Bradley, male    DOB: 1970/06/02, 50 y.o.   MRN: [id number]  HPI Here for a well exam. He feels fine except for he typical back pain and some pain in the left hip and left knee.    Review of Systems  Constitutional: Negative.   HENT: Negative.   Eyes: Negative.   Respiratory: Negative.   Cardiovascular: Negative.   Gastrointestinal: Negative.   Genitourinary: Negative.   Musculoskeletal: Positive for arthralgias and back pain.  Skin: Negative.   Neurological: Negative.   Psychiatric/Behavioral: Negative.        Objective:   Physical Exam Constitutional:      General: He is not in acute distress.    Appearance: Normal appearance. He is well-developed. He is not diaphoretic.  HENT:     Head: Normocephalic and atraumatic.     Right Ear: External ear normal.     Left Ear: External ear normal.     Nose: Nose normal.     Mouth/Throat:     Pharynx: No oropharyngeal exudate.  Eyes:     General: No scleral icterus.       Right eye: No discharge.        Left eye: No discharge.     Conjunctiva/sclera: Conjunctivae normal.     Pupils: Pupils are equal, round, and reactive to light.  Neck:     Thyroid: No thyromegaly.     Vascular: No JVD.     Trachea: No tracheal deviation.  Cardiovascular:     Rate and Rhythm: Normal rate and regular rhythm.     Heart sounds: Normal heart sounds. No murmur heard. No friction rub. No gallop.   Pulmonary:     Effort: Pulmonary effort is normal. No respiratory distress.     Breath sounds: Normal breath sounds. No wheezing or rales.  Chest:     Chest wall: No tenderness.  Abdominal:     General: Bowel sounds are normal. There is no distension.     Palpations: Abdomen is soft. There is no mass.     Tenderness: There is no abdominal tenderness. There is no guarding or rebound.  Genitourinary:    Penis: Normal. No tenderness.      Testes: Normal.     Rectum: Normal. Guaiac result negative.      Comments: Prostate is absent  Musculoskeletal:        General: No tenderness. Normal range of motion.     Cervical back: Neck supple.  Lymphadenopathy:     Cervical: No cervical adenopathy.  Skin:    General: Skin is warm and dry.     Coloration: Skin is not pale.     Findings: No erythema or rash.  Neurological:     Mental Status: He is alert and oriented to person, place, and time.     Cranial Nerves: No cranial nerve deficit.     Motor: No abnormal muscle tone.     Coordination: Coordination normal.     Deep Tendon Reflexes: Reflexes are normal and symmetric. Reflexes normal.  Psychiatric:        Behavior: Behavior normal.        Thought Content: Thought content normal.        Judgment: Judgment normal.           Assessment & Plan:  Well exam. We discussed diet and exercise. Get fasting labs  Alysia Penna, MD

## 2020-05-23 ENCOUNTER — Other Ambulatory Visit: Payer: Self-pay

## 2020-05-23 DIAGNOSIS — R7989 Other specified abnormal findings of blood chemistry: Secondary | ICD-10-CM

## 2020-05-23 MED ORDER — LEVOTHYROXINE SODIUM 75 MCG PO TABS: 75.0000 ug | ORAL_TABLET | Freq: Every day | ORAL | 3 refills | Status: DC

## 2020-06-21 ENCOUNTER — Other Ambulatory Visit: Payer: Self-pay

## 2020-06-21 ENCOUNTER — Encounter: Payer: Self-pay | Admitting: Family Medicine

## 2020-06-21 ENCOUNTER — Ambulatory Visit (INDEPENDENT_AMBULATORY_CARE_PROVIDER_SITE_OTHER): Payer: 59 | Admitting: Family Medicine

## 2020-06-21 VITALS — BP 122/78 | HR 79 | Temp 98.2°F | Wt 160.0 lb

## 2020-06-21 DIAGNOSIS — R82998 Other abnormal findings in urine: Secondary | ICD-10-CM | POA: Diagnosis not present

## 2020-06-21 DIAGNOSIS — R319 Hematuria, unspecified: Secondary | ICD-10-CM | POA: Diagnosis not present

## 2020-06-21 LAB — POC URINALSYSI DIPSTICK (AUTOMATED)
Bilirubin, UA: NEGATIVE
Blood, UA: NEGATIVE
Glucose, UA: NEGATIVE
Ketones, UA: NEGATIVE
Leukocytes, UA: NEGATIVE
Nitrite, UA: NEGATIVE
Protein, UA: POSITIVE — AB
Spec Grav, UA: >=1.03 — AB (ref 1.010–1.025)
Urobilinogen, UA: 0.2 E.U./dL
pH, UA: 5.5 (ref 5.0–8.0)

## 2020-06-21 NOTE — Progress Notes (Signed)
   Subjective:    Patient ID: James Bradley, male    DOB: 06-08-1970, 50 y.o.   MRN: [id number]  HPI Here with concerns about dark urine that he noticed 3 days ago. This has cleared up the past 2 days, no discomfort or urgency. He notes that he recently started drinking beet juice for its health benefits.    Review of Systems  Constitutional: Negative.   Respiratory: Negative.    Cardiovascular: Negative.   Genitourinary:  Negative for difficulty urinating, dysuria, frequency, hematuria, penile discharge and urgency.      Objective:   Physical Exam Constitutional:      Appearance: Normal appearance.  Cardiovascular:     Rate and Rhythm: Normal rate and regular rhythm.     Pulses: Normal pulses.     Heart sounds: Normal heart sounds.  Pulmonary:     Effort: Pulmonary effort is normal.     Breath sounds: Normal breath sounds.  Neurological:     Mental Status: He is alert.          Assessment & Plan:  His urine is clear today. I think the discoloration he saw was due to the beet juice. We discussed how this can change the appearance of both urine and stools. Recheck as needed.  Alysia Penna, MD

## 2020-08-07 ENCOUNTER — Telehealth (INDEPENDENT_AMBULATORY_CARE_PROVIDER_SITE_OTHER): Payer: 59 | Admitting: Family Medicine

## 2020-08-07 ENCOUNTER — Encounter: Payer: Self-pay | Admitting: Family Medicine

## 2020-08-07 DIAGNOSIS — M5440 Lumbago with sciatica, unspecified side: Secondary | ICD-10-CM

## 2020-08-07 DIAGNOSIS — G8929 Other chronic pain: Secondary | ICD-10-CM

## 2020-08-07 DIAGNOSIS — F1190 Opioid use, unspecified, uncomplicated: Secondary | ICD-10-CM

## 2020-08-07 MED ORDER — OXYCODONE-ACETAMINOPHEN 10-325 MG PO TABS: 1.0000 | ORAL_TABLET | Freq: Four times a day (QID) | ORAL | 0 refills | Status: DC | PRN

## 2020-08-07 NOTE — Progress Notes (Signed)
Subjective:    Patient ID: James Bradley, male    DOB: 11-23-1970, 50 y.o.   MRN: [id number]  HPI Virtual Visit via Video Note  I connected with the patient on 08/07/20 at  3:45 PM EDT by a video enabled telemedicine application and verified that I am speaking with the correct person using two identifiers.  Location patient: home Location provider:work or home office Persons participating in the virtual visit: patient, provider  I discussed the limitations of evaluation and management by telemedicine and the availability of in person appointments. The patient expressed understanding and agreed to proceed.   HPI: Here for pain management, he is doing well.    ROS: See pertinent positives and negatives per HPI.  Past Medical History:  Diagnosis Date   Anxiety    Cancer [facility])    prostate , sees Dr. Jeffie Pollock    GERD (gastroesophageal reflux disease)    Herpes labialis    Insomnia    Low back pain    Onychomycosis    Prostatitis     Past Surgical History:  Procedure Laterality Date   COLONOSCOPY  August 2013    per Dr. Carol Ada , clear, repeat in 10 yrs    ESI Dr Lorin Mercy     microdiskectomy  2009   at L5-S1, per Dr. Kerby Nora    PROSTATECTOMY  08-20-10   per Dr. Jeffie Pollock    Family History  Problem Relation Age of Onset   Arthritis Other    Diabetes Other      Current Outpatient Medications:    ALPRAZolam (XANAX) 1 MG tablet, TAKE 1 TABLET (1 MG TOTAL) BY MOUTH 3 (THREE) TIMES DAILY AS NEEDED. FOR ANXIETY, Disp: 90 tablet, Rfl: 5   amphetamine-dextroamphetamine (ADDERALL) 20 MG tablet, Take 1 tablet (20 mg total) by mouth daily., Disp: 30 tablet, Rfl: 0   cyclobenzaprine (FLEXERIL) 10 MG tablet, Take 1 tablet (10 mg total) by mouth 3 (three) times daily as needed for muscle spasms., Disp: 60 tablet, Rfl: 5   escitalopram (LEXAPRO) 10 MG tablet, Take 1 tablet (10 mg total) by mouth daily., Disp: 90 tablet, Rfl: 3   levothyroxine (SYNTHROID) 75 MCG tablet, Take 1  tablet (75 mcg total) by mouth daily., Disp: 90 tablet, Rfl: 3   solifenacin (VESICARE) 5 MG tablet, Take 1 tablet (5 mg total) by mouth daily., Disp: 90 tablet, Rfl: 3   valACYclovir (VALTREX) 500 MG tablet, Take 1 tablet (500 mg total) by mouth 2 (two) times daily as needed (cold sores)., Disp: 60 tablet, Rfl: 11   zolpidem (AMBIEN) 10 MG tablet, Take 1 tablet (10 mg total) by mouth at bedtime as needed. for sleep, Disp: 30 tablet, Rfl: 5   [START ON 10/14/2020] oxyCODONE-acetaminophen (PERCOCET) 10-325 MG tablet, Take 1 tablet by mouth every 6 (six) hours as needed for pain., Disp: 120 tablet, Rfl: 0  EXAM:  VITALS per patient if applicable:  GENERAL: alert, oriented, appears well and in no acute distress  HEENT: atraumatic, conjunttiva clear, no obvious abnormalities on inspection of external nose and ears  NECK: normal movements of the head and neck  LUNGS: on inspection no signs of respiratory distress, breathing rate appears normal, no obvious gross SOB, gasping or wheezing  CV: no obvious cyanosis  MS: moves all visible extremities without noticeable abnormality  PSYCH/NEURO: pleasant and cooperative, no obvious depression or anxiety, speech and thought processing grossly intact  ASSESSMENT AND PLAN: Pain management.  Indication for chronic opioid: low back  pain Medication and dose: Percocet 10-325 # pills per month: 120 Last UDS date: 02-09-20 Opioid Treatment Agreement signed (Y/N): 04-19-17 Opioid Treatment Agreement last reviewed with patient:  08-07-20 NCCSRS reviewed this encounter (include red flags): Yes Meds were refilled.  Alysia Penna, MD  Discussed the following assessment and plan:  No diagnosis found.     I discussed the assessment and treatment plan with the patient. The patient was provided an opportunity to ask questions and all were answered. The patient agreed with the plan and demonstrated an understanding of the instructions.   The patient was  advised to call back or seek an in-person evaluation if the symptoms worsen or if the condition fails to improve as anticipated.      Review of Systems     Objective:   Physical Exam        Assessment & Plan:

## 2020-08-20 ENCOUNTER — Other Ambulatory Visit: Payer: Self-pay

## 2020-08-20 ENCOUNTER — Other Ambulatory Visit: Payer: 59

## 2020-08-20 DIAGNOSIS — R7989 Other specified abnormal findings of blood chemistry: Secondary | ICD-10-CM

## 2020-08-20 LAB — TSH: TSH: 0.18 u[IU]/mL — ABNORMAL LOW (ref 0.35–5.50)

## 2020-08-21 ENCOUNTER — Other Ambulatory Visit: Payer: Self-pay

## 2020-08-21 DIAGNOSIS — R7989 Other specified abnormal findings of blood chemistry: Secondary | ICD-10-CM

## 2020-10-17 ENCOUNTER — Encounter: Payer: Self-pay | Admitting: Family Medicine

## 2020-10-17 ENCOUNTER — Ambulatory Visit (INDEPENDENT_AMBULATORY_CARE_PROVIDER_SITE_OTHER): Payer: 59 | Admitting: Family Medicine

## 2020-10-17 ENCOUNTER — Other Ambulatory Visit: Payer: Self-pay

## 2020-10-17 VITALS — BP 120/76 | HR 98 | Temp 98.2°F | Wt 149.0 lb

## 2020-10-17 DIAGNOSIS — M160 Bilateral primary osteoarthritis of hip: Secondary | ICD-10-CM

## 2020-10-17 DIAGNOSIS — M25552 Pain in left hip: Secondary | ICD-10-CM

## 2020-10-17 NOTE — Progress Notes (Signed)
   Subjective:    Patient ID: James Bradley, male    DOB: 01/25/1970, 50 y.o.   MRN: [id number]  HPI Here for 3 weeks of progressive pain in the left hip. No recent trauma. It hurts to walk. Most of the pain is in the left groin area, but some also shoots through to the lateral hip area. He takes Percocet regularly for low back pain. No change in BMs or urinary habits.    Review of Systems  Constitutional: Negative.   Respiratory: Negative.    Cardiovascular: Negative.   Gastrointestinal: Negative.   Genitourinary: Negative.   Musculoskeletal:  Positive for arthralgias.      Objective:   Physical Exam Constitutional:      Comments: He walks with a limp   Cardiovascular:     Rate and Rhythm: Normal rate and regular rhythm.     Pulses: Normal pulses.     Heart sounds: Normal heart sounds.  Pulmonary:     Effort: Pulmonary effort is normal.     Breath sounds: Normal breath sounds.  Abdominal:     General: Abdomen is flat. Bowel sounds are normal. There is no distension.     Palpations: Abdomen is soft. There is no mass.     Tenderness: There is no abdominal tenderness. There is no guarding or rebound.     Hernia: No hernia is present.  Musculoskeletal:     Comments: Left hip is not tender, but ROM is quite limited by pain, especially with internal/external rotation   Neurological:     Mental Status: He is alert.          Assessment & Plan:  Left hip pain. He can take some Ibuprofen as needed. We will get Xrays of both hips.  Alysia Penna, MD

## 2020-10-18 ENCOUNTER — Ambulatory Visit (INDEPENDENT_AMBULATORY_CARE_PROVIDER_SITE_OTHER)
Admission: RE | Admit: 2020-10-18 | Discharge: 2020-10-18 | Disposition: A | Discharge status: Home / Self Care | Payer: 59 | Source: Ambulatory Visit | Attending: Family Medicine | Admitting: Family Medicine

## 2020-10-18 DIAGNOSIS — M25552 Pain in left hip: Secondary | ICD-10-CM | POA: Diagnosis not present

## 2020-10-18 DIAGNOSIS — M160 Bilateral primary osteoarthritis of hip: Secondary | ICD-10-CM

## 2020-10-23 ENCOUNTER — Telehealth: Payer: Self-pay | Admitting: Family Medicine

## 2020-10-23 ENCOUNTER — Other Ambulatory Visit: Payer: Self-pay | Admitting: Family Medicine

## 2020-10-23 NOTE — Telephone Encounter (Signed)
Patient called to return a call that he received from the office. Patient said they did not leave name, and only said to call back. I didn't see anything in chart. Patient also needs to get refill for ALPRAZolam James Bradley) 1 MG tablet   [facility] to   [id number] [address] Phone:  [phone]  Fax:  [phone]       Good callback number is [phone]    Please Advise

## 2020-10-23 NOTE — Telephone Encounter (Signed)
Last refill- 04/17/20--90 tabs, 5 refills.   1 tab po tid, prn. Last office visit- 10/17/20  No future office visit scheduled.  Can this patient receive a refill?

## 2020-10-24 MED ORDER — ALPRAZOLAM 1 MG PO TABS: 1.0000 mg | ORAL_TABLET | Freq: Three times a day (TID) | ORAL | 5 refills | Status: DC | PRN

## 2020-10-24 NOTE — Telephone Encounter (Signed)
Done

## 2020-10-24 NOTE — Telephone Encounter (Addendum)
Spoke with patient to make aware prescription has been sent to pharmacy.  Discussed x-ray results

## 2020-10-25 DIAGNOSIS — M169 Osteoarthritis of hip, unspecified: Secondary | ICD-10-CM | POA: Insufficient documentation

## 2020-10-25 NOTE — Addendum Note (Signed)
Addended by: Alysia Penna A on: 10/25/2020 08:13 AM   Modules accepted: Orders

## 2020-11-01 ENCOUNTER — Ambulatory Visit: Payer: 59 | Admitting: Orthopaedic Surgery

## 2020-11-06 ENCOUNTER — Telehealth (INDEPENDENT_AMBULATORY_CARE_PROVIDER_SITE_OTHER): Payer: 59 | Admitting: Family Medicine

## 2020-11-06 ENCOUNTER — Encounter: Payer: Self-pay | Admitting: Family Medicine

## 2020-11-06 DIAGNOSIS — F1190 Opioid use, unspecified, uncomplicated: Secondary | ICD-10-CM | POA: Diagnosis not present

## 2020-11-06 DIAGNOSIS — M5441 Lumbago with sciatica, right side: Secondary | ICD-10-CM

## 2020-11-06 DIAGNOSIS — M5440 Lumbago with sciatica, unspecified side: Secondary | ICD-10-CM | POA: Diagnosis not present

## 2020-11-06 DIAGNOSIS — M160 Bilateral primary osteoarthritis of hip: Secondary | ICD-10-CM | POA: Diagnosis not present

## 2020-11-06 DIAGNOSIS — G8929 Other chronic pain: Secondary | ICD-10-CM

## 2020-11-06 MED ORDER — AMPHETAMINE-DEXTROAMPHETAMINE 20 MG PO TABS: 20.0000 mg | ORAL_TABLET | Freq: Every day | ORAL | 0 refills | Status: DC

## 2020-11-06 MED ORDER — PERCOCET 10-325 MG PO TABS: 1.0000 | ORAL_TABLET | Freq: Four times a day (QID) | ORAL | 0 refills | Status: DC | PRN

## 2020-11-06 MED ORDER — ZOLPIDEM TARTRATE 10 MG PO TABS: 10.0000 mg | ORAL_TABLET | Freq: Every evening | ORAL | 5 refills | Status: DC | PRN

## 2020-11-06 MED ORDER — PERCOCET 10-325 MG PO TABS: 1.0000 | ORAL_TABLET | Freq: Four times a day (QID) | ORAL | 0 refills | Status: AC | PRN

## 2020-11-06 NOTE — Progress Notes (Signed)
Subjective:    Patient ID: James Bradley, male    DOB: 11-13-1970, 50 y.o.   MRN: [id number]  HPI Virtual Visit via Video Note  I connected with the patient on 11/06/20 at  8:15 AM EDT by a video enabled telemedicine application and verified that I am speaking with the correct person using two identifiers.  Location patient: home Location provider:work or home office Persons participating in the virtual visit: patient, provider  I discussed the limitations of evaluation and management by telemedicine and the availability of in person appointments. The patient expressed understanding and agreed to proceed.   HPI: Here for pain management. He has been taking generic Percocet, but last month his pharmacy switched to a different manufacturer. He says he cannot take this type because it upsets his stomach too much.    ROS: See pertinent positives and negatives per HPI.  Past Medical History:  Diagnosis Date   Anxiety    Cancer [facility])    prostate , sees Dr. Jeffie Pollock    GERD (gastroesophageal reflux disease)    Herpes labialis    Insomnia    Low back pain    Onychomycosis    Prostatitis     Past Surgical History:  Procedure Laterality Date   COLONOSCOPY  August 2013    per Dr. Carol Ada , clear, repeat in 10 yrs    ESI Dr Lorin Mercy     microdiskectomy  2009   at L5-S1, per Dr. Kerby Nora    PROSTATECTOMY  08-20-10   per Dr. Jeffie Pollock    Family History  Problem Relation Age of Onset   Arthritis Other    Diabetes Other      Current Outpatient Medications:    ALPRAZolam (XANAX) 1 MG tablet, Take 1 tablet (1 mg total) by mouth 3 (three) times daily as needed. for anxiety, Disp: 90 tablet, Rfl: 5   cyclobenzaprine (FLEXERIL) 10 MG tablet, Take 1 tablet (10 mg total) by mouth 3 (three) times daily as needed for muscle spasms., Disp: 60 tablet, Rfl: 5   escitalopram (LEXAPRO) 10 MG tablet, Take 1 tablet (10 mg total) by mouth daily., Disp: 90 tablet, Rfl: 3   levothyroxine  (SYNTHROID) 75 MCG tablet, Take 1 tablet (75 mcg total) by mouth daily., Disp: 90 tablet, Rfl: 3   PERCOCET 10-325 MG tablet, Take 1 tablet by mouth every 6 (six) hours as needed for pain., Disp: 120 tablet, Rfl: 0   solifenacin (VESICARE) 5 MG tablet, Take 1 tablet (5 mg total) by mouth daily., Disp: 90 tablet, Rfl: 3   valACYclovir (VALTREX) 500 MG tablet, Take 1 tablet (500 mg total) by mouth 2 (two) times daily as needed (cold sores)., Disp: 60 tablet, Rfl: 11   amphetamine-dextroamphetamine (ADDERALL) 20 MG tablet, Take 1 tablet (20 mg total) by mouth daily., Disp: 30 tablet, Rfl: 0   zolpidem (AMBIEN) 10 MG tablet, Take 1 tablet (10 mg total) by mouth at bedtime as needed for sleep. for sleep, Disp: 30 tablet, Rfl: 5  EXAM:  VITALS per patient if applicable:  GENERAL: alert, oriented, appears well and in no acute distress  HEENT: atraumatic, conjunttiva clear, no obvious abnormalities on inspection of external nose and ears  NECK: normal movements of the head and neck  LUNGS: on inspection no signs of respiratory distress, breathing rate appears normal, no obvious gross SOB, gasping or wheezing  CV: no obvious cyanosis  MS: moves all visible extremities without noticeable abnormality  PSYCH/NEURO: pleasant and cooperative,  no obvious depression or anxiety, speech and thought processing grossly intact  ASSESSMENT AND PLAN: Pain management. Indication for chronic opioid: low back pain Medication and dose: Percocet 10-325 # pills per month: 120 Last UDS date: 02-09-20 Opioid Treatment Agreement signed (Y/N): 04-19-17 Opioid Treatment Agreement last reviewed with patient:  11-06-20 NCCSRS reviewed this encounter (include red flags): Yes We will change this to [place].  Alysia Penna, MD  Discussed the following assessment and plan:  No diagnosis found.     I discussed the assessment and treatment plan with the patient. The patient was provided an opportunity to ask  questions and all were answered. The patient agreed with the plan and demonstrated an understanding of the instructions.   The patient was advised to call back or seek an in-person evaluation if the symptoms worsen or if the condition fails to improve as anticipated.      Review of Systems     Objective:   Physical Exam        Assessment & Plan:

## 2021-02-06 ENCOUNTER — Ambulatory Visit (INDEPENDENT_AMBULATORY_CARE_PROVIDER_SITE_OTHER): Payer: 59 | Admitting: Family Medicine

## 2021-02-06 ENCOUNTER — Encounter: Payer: Self-pay | Admitting: Family Medicine

## 2021-02-06 VITALS — BP 110/76 | HR 72 | Temp 98.7°F | Wt 153.0 lb

## 2021-02-06 DIAGNOSIS — F1190 Opioid use, unspecified, uncomplicated: Secondary | ICD-10-CM

## 2021-02-06 DIAGNOSIS — M5440 Lumbago with sciatica, unspecified side: Secondary | ICD-10-CM | POA: Diagnosis not present

## 2021-02-06 DIAGNOSIS — G8929 Other chronic pain: Secondary | ICD-10-CM | POA: Diagnosis not present

## 2021-02-06 MED ORDER — AMPHETAMINE-DEXTROAMPHETAMINE 20 MG PO TABS: 20.0000 mg | ORAL_TABLET | Freq: Every day | ORAL | 0 refills | Status: DC

## 2021-02-06 MED ORDER — PERCOCET 10-325 MG PO TABS: 1.0000 | ORAL_TABLET | Freq: Four times a day (QID) | ORAL | 0 refills | Status: DC | PRN

## 2021-02-06 MED ORDER — OMEPRAZOLE 40 MG PO CPDR: 40.0000 mg | DELAYED_RELEASE_CAPSULE | Freq: Every day | ORAL | 3 refills | Status: DC

## 2021-02-06 NOTE — Progress Notes (Signed)
° °  Subjective:    Patient ID: James Bradley, male    DOB: October 01, 1970, 51 y.o.   MRN: [id number]  HPI Here for pain management. He is doing well. Last time we changed to brand name Percocet, and this works much better for his back pain.    Review of Systems  Constitutional: Negative.   Respiratory: Negative.    Cardiovascular: Negative.   Musculoskeletal:  Positive for back pain.      Objective:   Physical Exam Constitutional:      Appearance: Normal appearance.  Musculoskeletal:     Comments: Tender in the lower back with full ROM   Neurological:     Mental Status: He is alert.          Assessment & Plan:  Pain management. Indication for chronic opioid: low back pain Medication and dose: name brand Percocet 10-325  # pills per month: 120 Last UDS date: 02-06-21 Opioid Treatment Agreement signed (Y/N): 04-19-17 Opioid Treatment Agreement last reviewed with patient:  02-06-21 NCCSRS reviewed this encounter (include red flags): Yes Meds were refilled.  Alysia Penna, MD

## 2021-02-08 LAB — DRUG MONITOR, PANEL 1, W/CONF, URINE
Alphahydroxyalprazolam: 1556 ng/mL — ABNORMAL HIGH (ref <25)
Alphahydroxymidazolam: NEGATIVE ng/mL (ref <50)
Alphahydroxytriazolam: NEGATIVE ng/mL (ref <50)
Aminoclonazepam: NEGATIVE ng/mL (ref <25)
Amphetamine: 3324 ng/mL — ABNORMAL HIGH (ref <250)
Amphetamines: POSITIVE ng/mL — AB (ref <500)
Barbiturates: NEGATIVE ng/mL (ref <300)
Benzodiazepines: POSITIVE ng/mL — AB (ref <100)
Cocaine Metabolite: NEGATIVE ng/mL (ref <150)
Codeine: NEGATIVE ng/mL (ref <50)
Creatinine: >300 mg/dL (ref >=20.0)
Hydrocodone: NEGATIVE ng/mL (ref <50)
Hydromorphone: NEGATIVE ng/mL (ref <50)
Hydroxyethylflurazepam: NEGATIVE ng/mL (ref <50)
Lorazepam: NEGATIVE ng/mL (ref <50)
Marijuana Metabolite: NEGATIVE ng/mL (ref <20)
Methadone Metabolite: NEGATIVE ng/mL (ref <100)
Methamphetamine: NEGATIVE ng/mL (ref <250)
Morphine: NEGATIVE ng/mL (ref <50)
Nordiazepam: NEGATIVE ng/mL (ref <50)
Norhydrocodone: NEGATIVE ng/mL (ref <50)
Noroxycodone: >10000 ng/mL — ABNORMAL HIGH (ref <50)
Opiates: NEGATIVE ng/mL (ref <100)
Oxazepam: NEGATIVE ng/mL (ref <50)
Oxidant: NEGATIVE ug/mL (ref <200)
Oxycodone: 4479 ng/mL — ABNORMAL HIGH (ref <50)
Oxycodone: POSITIVE ng/mL — AB (ref <100)
Oxymorphone: 2553 ng/mL — ABNORMAL HIGH (ref <50)
Phencyclidine: NEGATIVE ng/mL (ref <25)
Temazepam: NEGATIVE ng/mL (ref <50)
pH: 6 (ref 4.5–9.0)

## 2021-02-08 LAB — DM TEMPLATE

## 2021-02-18 ENCOUNTER — Other Ambulatory Visit: Payer: 59

## 2021-03-04 ENCOUNTER — Telehealth: Payer: Self-pay | Admitting: Family Medicine

## 2021-03-04 MED ORDER — PERCOCET 10-325 MG PO TABS: 1.0000 | ORAL_TABLET | Freq: Four times a day (QID) | ORAL | 0 refills | Status: DC | PRN

## 2021-03-04 NOTE — Telephone Encounter (Signed)
I sent in a new RX dated today

## 2021-03-04 NOTE — Telephone Encounter (Signed)
[facility] called in stating that [facility] could [facility] it but Dr.Fry just needs to change the date from the 25th to 02/20.  Please advise.

## 2021-03-04 NOTE — Telephone Encounter (Signed)
Please advise 

## 2021-03-04 NOTE — Telephone Encounter (Signed)
Patient called in requesting a refill for PERCOCET 10-325 MG tablet [[id number]] to be sent to his pharmacy. Patient stated that he only had refills for last month and next month so the pharmacy advised him to reach out to Dr.Fry to see if he could get a refill for this month. Patient states that he's on his last two pills.  Patient could be contacted at [phone].  Please advise.

## 2021-03-05 NOTE — Telephone Encounter (Signed)
Message complete on 03/04/21

## 2021-03-07 NOTE — Telephone Encounter (Signed)
Patient called in stating that he was supposed to have this medication filled today according to the automated system but [facility] is [facility]'t [facility]. Patient would like for someone to contact [facility] to see what's going on because he's out of the medication and wasn't able to go to work due to the pain he's experiencing.  Please advise.

## 2021-03-07 NOTE — Telephone Encounter (Signed)
Spoke with pt pharmacy state that the Rx they have on file for Percocet is locked for refill date 03/09/21 and have no way to change refill date to today, pharmacy advise to send a new Rx and then they will delete what the have on file. Please advise

## 2021-03-07 NOTE — Telephone Encounter (Signed)
Please call the pharmacy and ask them to fill the 03-09-21 RX today (2 days early)

## 2021-03-07 NOTE — Telephone Encounter (Signed)
[facility] spoke [facility] tech, she checked patients profile and the prescription that was sent in for Percocet 10-325 on 03/04/21 they never received.      I asked if I could give a verbal unable to so, due to being scheduled 2 narcotic.      The previous Percocet prescription that was dated 03/09/21 is still on file, pharmacy tech said that one can stay on file or either canceled it.  There is also another Percocet script on file dated 04/06/2021.

## 2021-03-08 MED ORDER — PERCOCET 10-325 MG PO TABS: 1.0000 | ORAL_TABLET | Freq: Four times a day (QID) | ORAL | 0 refills | Status: AC | PRN

## 2021-03-08 NOTE — Addendum Note (Signed)
Addended by: Alysia Penna A on: 03/08/2021 07:58 AM   Modules accepted: Orders

## 2021-03-08 NOTE — Telephone Encounter (Signed)
I sent in a new RX dated today

## 2021-03-12 NOTE — Telephone Encounter (Signed)
Dr Sarajane Jews message sent to pt via [place]

## 2021-04-30 ENCOUNTER — Telehealth (INDEPENDENT_AMBULATORY_CARE_PROVIDER_SITE_OTHER): Payer: 59 | Admitting: Family Medicine

## 2021-04-30 ENCOUNTER — Encounter: Payer: Self-pay | Admitting: Family Medicine

## 2021-04-30 DIAGNOSIS — M5440 Lumbago with sciatica, unspecified side: Secondary | ICD-10-CM

## 2021-04-30 DIAGNOSIS — F1190 Opioid use, unspecified, uncomplicated: Secondary | ICD-10-CM

## 2021-04-30 DIAGNOSIS — G8929 Other chronic pain: Secondary | ICD-10-CM

## 2021-04-30 MED ORDER — PERCOCET 10-325 MG PO TABS: 1.0000 | ORAL_TABLET | Freq: Four times a day (QID) | ORAL | 0 refills | Status: DC | PRN

## 2021-04-30 MED ORDER — AMPHETAMINE-DEXTROAMPHETAMINE 20 MG PO TABS: 20.0000 mg | ORAL_TABLET | Freq: Every day | ORAL | 0 refills | Status: DC

## 2021-04-30 MED ORDER — ALPRAZOLAM 1 MG PO TABS: 1.0000 mg | ORAL_TABLET | Freq: Three times a day (TID) | ORAL | 5 refills | Status: DC | PRN

## 2021-04-30 MED ORDER — ZOLPIDEM TARTRATE 10 MG PO TABS: 10.0000 mg | ORAL_TABLET | Freq: Every evening | ORAL | 5 refills | Status: DC | PRN

## 2021-05-01 ENCOUNTER — Telehealth: Payer: Self-pay | Admitting: Family Medicine

## 2021-05-01 ENCOUNTER — Encounter: Payer: Self-pay | Admitting: Family Medicine

## 2021-05-01 NOTE — Telephone Encounter (Signed)
Pt calling in regarding prescription for PERCOCET 10-325 MG tablet. States previous prescriptions were for 120 for a 90 day supply and he is requesting the prescription be filled that way going forward. [id number] [place] [id number] ?Phone:  [phone] Fax:  [phone] ?

## 2021-05-01 NOTE — Telephone Encounter (Signed)
Current Percocet 10-325 mg script was sent to pharmacy for 30 tabs.  ?

## 2021-05-02 NOTE — Telephone Encounter (Signed)
Previous telephone message has been sent with exact request.  ?

## 2021-05-03 ENCOUNTER — Other Ambulatory Visit: Payer: Self-pay | Admitting: Family Medicine

## 2021-05-06 ENCOUNTER — Telehealth: Payer: Self-pay | Admitting: Family Medicine

## 2021-05-06 MED ORDER — PERCOCET 10-325 MG PO TABS: 1.0000 | ORAL_TABLET | Freq: Four times a day (QID) | ORAL | 0 refills | Status: DC | PRN

## 2021-05-06 NOTE — Telephone Encounter (Signed)
I sent in a refill for 120 tablets early this morning  ?

## 2021-05-06 NOTE — Progress Notes (Signed)
? ?Subjective:  ? ? Patient ID: James Bradley, male    DOB: July 04, 1970, 51 y.o.   MRN: [id number] ? ?HPI ?Virtual Visit via Video Note ? ?I connected with the patient on 05/06/21 at  3:30 PM EDT by a video enabled telemedicine application and verified that I am speaking with the correct person using two identifiers. ? Location patient: home ?Location provider:work or home office ?Persons participating in the virtual visit: patient, provider ? ?I discussed the limitations of evaluation and management by telemedicine and the availability of in person appointments. The patient expressed understanding and agreed to proceed. ? ? ?HPI: ?Here for pain management. His back pain is fairly stable.  ? ? ?ROS: See pertinent positives and negatives per HPI. ? ?Past Medical History:  ?Diagnosis Date  ? Anxiety   ? Cancer [facility])   ? prostate , sees Dr. Jeffie Pollock   ? GERD (gastroesophageal reflux disease)   ? Herpes labialis   ? Insomnia   ? Low back pain   ? Onychomycosis   ? Prostatitis   ? ? ?Past Surgical History:  ?Procedure Laterality Date  ? COLONOSCOPY  August 2013  ?  per Dr. Carol Ada , clear, repeat in 10 yrs   ? ESI Dr Lorin Mercy    ? microdiskectomy  2009  ? at L5-S1, per Dr. Kerby Nora   ? PROSTATECTOMY  08-20-10  ? per Dr. Jeffie Pollock  ? ? ?Family History  ?Problem Relation Age of Onset  ? Arthritis Other   ? Diabetes Other   ? ? ? ?Current Outpatient Medications:  ?  levothyroxine (SYNTHROID) 75 MCG tablet, Take 1 tablet (75 mcg total) by mouth daily., Disp: 90 tablet, Rfl: 3 ?  omeprazole (PRILOSEC) 40 MG capsule, Take 1 capsule (40 mg total) by mouth daily., Disp: 90 capsule, Rfl: 3 ?  valACYclovir (VALTREX) 500 MG tablet, Take 1 tablet (500 mg total) by mouth 2 (two) times daily as needed (cold sores)., Disp: 60 tablet, Rfl: 11 ?  [START ON 05/07/2021] ALPRAZolam (XANAX) 1 MG tablet, Take 1 tablet (1 mg total) by mouth 3 (three) times daily as needed. for anxiety, Disp: 90 tablet, Rfl: 5 ?  [START ON 07/07/2021]  amphetamine-dextroamphetamine (ADDERALL) 20 MG tablet, Take 1 tablet (20 mg total) by mouth daily., Disp: 30 tablet, Rfl: 0 ?  [START ON 06/06/2021] PERCOCET 10-325 MG tablet, Take 1 tablet by mouth every 6 (six) hours as needed for pain., Disp: 120 tablet, Rfl: 0 ?  [START ON 05/07/2021] zolpidem (AMBIEN) 10 MG tablet, Take 1 tablet (10 mg total) by mouth at bedtime as needed for sleep. for sleep, Disp: 30 tablet, Rfl: 5 ? ?EXAM: ? ?VITALS per patient if applicable: ? ?GENERAL: alert, oriented, appears well and in no acute distress ? ?HEENT: atraumatic, conjunttiva clear, no obvious abnormalities on inspection of external nose and ears ? ?NECK: normal movements of the head and neck ? ?LUNGS: on inspection no signs of respiratory distress, breathing rate appears normal, no obvious gross SOB, gasping or wheezing ? ?CV: no obvious cyanosis ? ?MS: moves all visible extremities without noticeable abnormality ? ?PSYCH/NEURO: pleasant and cooperative, no obvious depression or anxiety, speech and thought processing grossly intact ? ?ASSESSMENT AND PLAN: ?Pain management. ?Indication for chronic opioid: low back pain ?Medication and dose: Percocet 10-325 ?# pills per month: 120 ?Last UDS date: 02-06-21 ?Opioid Treatment Agreement signed (Y/N): 04-19-17 ?Opioid Treatment Agreement last reviewed with patient:  04-30-21 ?NCCSRS reviewed this encounter (include red flags): Yes ?Meds  were refilled.  ?Alysia Penna, MD ? ? ?Discussed the following assessment and plan: ? ?No diagnosis found. ? ? ?  ?I discussed the assessment and treatment plan with the patient. The patient was provided an opportunity to ask questions and all were answered. The patient agreed with the plan and demonstrated an understanding of the instructions. ?  ?The patient was advised to call back or seek an in-person evaluation if the symptoms worsen or if the condition fails to improve as anticipated. ? ?  ? ? ?Review of Systems ? ?   ?Objective:  ? Physical  Exam ? ? ? ? ?   ?Assessment & Plan:  ? ? ?

## 2021-05-06 NOTE — Telephone Encounter (Signed)
Message complete.   See My Chart messages ?

## 2021-05-06 NOTE — Telephone Encounter (Signed)
[facility] Sarajane Jews send in a order for the Generic brand of Percocet 10-325 MG to be filled for tomorrow.  ? ?[id number] [place]  [phone] ?Fax:  [phone] ?  ? ?Please advise.  ?

## 2021-05-06 NOTE — Telephone Encounter (Signed)
My Chart message has been sent to Dr. Sarajane Jews with exact request.  ?

## 2021-05-06 NOTE — Telephone Encounter (Signed)
I sent in new RX for Percocet dated for 05-07-21 and for 06-06-21. He already has plenty of refills for Alprazolam available  ?

## 2021-05-07 MED ORDER — OXYCODONE-ACETAMINOPHEN 10-325 MG PO TABS: 1.0000 | ORAL_TABLET | Freq: Four times a day (QID) | ORAL | 0 refills | Status: DC | PRN

## 2021-05-07 NOTE — Telephone Encounter (Signed)
I sent in generic Percocet. Please call the pharmacy to cancel the name brand RX ?

## 2021-05-07 NOTE — Telephone Encounter (Signed)
Spoke with pt pharmacy advised that pt picked up Rx generic, pharmacist advised to d/c the Brand Rx per Dr Sarajane Jews ?

## 2021-05-21 ENCOUNTER — Other Ambulatory Visit: Payer: Self-pay | Admitting: Family Medicine

## 2021-05-21 MED ORDER — AMPHETAMINE-DEXTROAMPHETAMINE 20 MG PO TABS: 20.0000 mg | ORAL_TABLET | Freq: Every day | ORAL | 0 refills | Status: DC

## 2021-05-21 NOTE — Telephone Encounter (Signed)
I sent in Adderall to be filled today  ?

## 2021-05-31 ENCOUNTER — Encounter: Payer: 59 | Admitting: Family Medicine

## 2021-06-05 ENCOUNTER — Ambulatory Visit (INDEPENDENT_AMBULATORY_CARE_PROVIDER_SITE_OTHER): Payer: Commercial Managed Care - PPO | Admitting: Family Medicine

## 2021-06-05 ENCOUNTER — Encounter: Payer: Self-pay | Admitting: Family Medicine

## 2021-06-05 VITALS — BP 118/76 | HR 56 | Temp 99.2°F | Ht 65.0 in | Wt 150.5 lb

## 2021-06-05 DIAGNOSIS — Z209 Contact with and (suspected) exposure to unspecified communicable disease: Secondary | ICD-10-CM | POA: Diagnosis not present

## 2021-06-05 DIAGNOSIS — Z0000 Encounter for general adult medical examination without abnormal findings: Secondary | ICD-10-CM

## 2021-06-05 DIAGNOSIS — N529 Male erectile dysfunction, unspecified: Secondary | ICD-10-CM | POA: Insufficient documentation

## 2021-06-05 DIAGNOSIS — N5231 Erectile dysfunction following radical prostatectomy: Secondary | ICD-10-CM

## 2021-06-05 DIAGNOSIS — Z8546 Personal history of malignant neoplasm of prostate: Secondary | ICD-10-CM

## 2021-06-05 LAB — CBC WITH DIFFERENTIAL/PLATELET
Basophils Absolute: 0 10*3/uL (ref 0.0–0.1)
Basophils Relative: 0.9 % (ref 0.0–3.0)
Eosinophils Absolute: 0.1 10*3/uL (ref 0.0–0.7)
Eosinophils Relative: 2.5 % (ref 0.0–5.0)
HCT: 40.6 % (ref 39.0–52.0)
Hemoglobin: 13.1 g/dL (ref 13.0–17.0)
Lymphocytes Relative: 60 % — ABNORMAL HIGH (ref 12.0–46.0)
Lymphs Abs: 3.2 10*3/uL (ref 0.7–4.0)
MCHC: 32.2 g/dL (ref 30.0–36.0)
MCV: 71.7 fl — ABNORMAL LOW (ref 78.0–100.0)
Monocytes Absolute: 0.4 10*3/uL (ref 0.1–1.0)
Monocytes Relative: 7.8 % (ref 3.0–12.0)
Neutro Abs: 1.5 10*3/uL (ref 1.4–7.7)
Neutrophils Relative %: 28.8 % — ABNORMAL LOW (ref 43.0–77.0)
Platelets: 304 10*3/uL (ref 150.0–400.0)
RBC: 5.67 Mil/uL (ref 4.22–5.81)
RDW: 16.5 % — ABNORMAL HIGH (ref 11.5–15.5)
WBC: 5.3 10*3/uL (ref 4.0–10.5)

## 2021-06-05 LAB — BASIC METABOLIC PANEL
BUN: 11 mg/dL (ref 6–23)
CO2: 33 mEq/L — ABNORMAL HIGH (ref 19–32)
Calcium: 9.7 mg/dL (ref 8.4–10.5)
Chloride: 100 mEq/L (ref 96–112)
Creatinine, Ser: 1 mg/dL (ref 0.40–1.50)
GFR: 86.57 mL/min (ref >60.00)
Glucose, Bld: 91 mg/dL (ref 70–99)
Potassium: 4.4 mEq/L (ref 3.5–5.1)
Sodium: 139 mEq/L (ref 135–145)

## 2021-06-05 LAB — HEPATIC FUNCTION PANEL
ALT: 21 U/L (ref 0–53)
AST: 23 U/L (ref 0–37)
Albumin: 4.7 g/dL (ref 3.5–5.2)
Alkaline Phosphatase: 65 U/L (ref 39–117)
Bilirubin, Direct: 0.2 mg/dL (ref 0.0–0.3)
Total Bilirubin: 0.8 mg/dL (ref 0.2–1.2)
Total Protein: 6.7 g/dL (ref 6.0–8.3)

## 2021-06-05 LAB — T4, FREE: Free T4: 0.77 ng/dL (ref 0.60–1.60)

## 2021-06-05 LAB — T3, FREE: T3, Free: 3.3 pg/mL (ref 2.3–4.2)

## 2021-06-05 LAB — LIPID PANEL
Cholesterol: 171 mg/dL (ref 0–200)
HDL: 74.3 mg/dL (ref >39.00)
LDL Cholesterol: 73 mg/dL (ref 0–99)
NonHDL: 97.09
Total CHOL/HDL Ratio: 2
Triglycerides: 120 mg/dL (ref 0.0–149.0)
VLDL: 24 mg/dL (ref 0.0–40.0)

## 2021-06-05 LAB — TSH: TSH: 0.86 u[IU]/mL (ref 0.35–5.50)

## 2021-06-05 LAB — HEMOGLOBIN A1C: Hgb A1c MFr Bld: 6.1 % (ref 4.6–6.5)

## 2021-06-05 LAB — PSA: PSA: 0.03 ng/mL — ABNORMAL LOW (ref 0.10–4.00)

## 2021-06-05 MED ORDER — SILDENAFIL CITRATE 100 MG PO TABS: 100.0000 mg | ORAL_TABLET | ORAL | 11 refills | Status: DC | PRN

## 2021-06-05 NOTE — Progress Notes (Signed)
   Subjective:    Patient ID: James Bradley, male    DOB: [birth year], 51 y.o.   MRN: [id number]  HPI Here for a well exam. He is doing well. He does complain of some erectile issues and he would like to try something for that. He will be due for a colonoscopy this August.    Review of Systems  Constitutional: Negative.   HENT: Negative.    Eyes: Negative.   Respiratory: Negative.    Cardiovascular: Negative.   Gastrointestinal: Negative.   Genitourinary: Negative.   Musculoskeletal: Negative.   Skin: Negative.   Neurological: Negative.   Psychiatric/Behavioral: Negative.        Objective:   Physical Exam Constitutional:      General: He is not in acute distress.    Appearance: Normal appearance. He is well-developed. He is not diaphoretic.  HENT:     Head: Normocephalic and atraumatic.     Right Ear: External ear normal.     Left Ear: External ear normal.     Nose: Nose normal.     Mouth/Throat:     Pharynx: No oropharyngeal exudate.  Eyes:     General: No scleral icterus.       Right eye: No discharge.        Left eye: No discharge.     Conjunctiva/sclera: Conjunctivae normal.     Pupils: Pupils are equal, round, and reactive to light.  Neck:     Thyroid: No thyromegaly.     Vascular: No JVD.     Trachea: No tracheal deviation.  Cardiovascular:     Rate and Rhythm: Normal rate and regular rhythm.     Heart sounds: Normal heart sounds. No murmur heard.   No friction rub. No gallop.  Pulmonary:     Effort: Pulmonary effort is normal. No respiratory distress.     Breath sounds: Normal breath sounds. No wheezing or rales.  Chest:     Chest wall: No tenderness.  Abdominal:     General: Bowel sounds are normal. There is no distension.     Palpations: Abdomen is soft. There is no mass.     Tenderness: There is no abdominal tenderness. There is no guarding or rebound.  Genitourinary:    Penis: No tenderness.   Musculoskeletal:        General: No tenderness.  Normal range of motion.     Cervical back: Neck supple.  Lymphadenopathy:     Cervical: No cervical adenopathy.  Skin:    General: Skin is warm and dry.     Coloration: Skin is not pale.     Findings: No erythema or rash.  Neurological:     Mental Status: He is alert and oriented to person, place, and time.     Cranial Nerves: No cranial nerve deficit.     Motor: No abnormal muscle tone.     Coordination: Coordination normal.     Deep Tendon Reflexes: Reflexes are normal and symmetric. Reflexes normal.  Psychiatric:        Behavior: Behavior normal.        Thought Content: Thought content normal.        Judgment: Judgment normal.          Assessment & Plan:  Well exam. We discussed diet and exercise. Get fasting labs. He will try Viagra for the ED.  Alysia Penna, MD

## 2021-06-06 LAB — HEPATITIS C ANTIBODY
Hepatitis C Ab: NONREACTIVE
SIGNAL TO CUT-OFF: 0.07 (ref <1.00)

## 2021-06-21 ENCOUNTER — Telehealth: Payer: Self-pay | Admitting: Family Medicine

## 2021-06-21 ENCOUNTER — Other Ambulatory Visit (HOSPITAL_BASED_OUTPATIENT_CLINIC_OR_DEPARTMENT_OTHER): Payer: Self-pay

## 2021-06-21 MED ORDER — AMPHETAMINE-DEXTROAMPHETAMINE 20 MG PO TABS
20.0000 mg | ORAL_TABLET | Freq: Every day | ORAL | 0 refills | Status: DC
Start: 2021-07-21 — End: 2021-09-27
  Filled 2021-07-22: qty 30, 30d supply, fill #0

## 2021-06-21 MED ORDER — AMPHETAMINE-DEXTROAMPHETAMINE 20 MG PO TABS
20.0000 mg | ORAL_TABLET | Freq: Every day | ORAL | 0 refills | Status: DC
Start: 2021-06-21 — End: 2021-09-27
  Filled 2021-06-21 (×2): qty 30, 30d supply, fill #0

## 2021-06-21 MED ORDER — AMPHETAMINE-DEXTROAMPHETAMINE 20 MG PO TABS
20.0000 mg | ORAL_TABLET | Freq: Every day | ORAL | 0 refills | Status: DC
Start: 2021-08-21 — End: 2021-09-27
  Filled 2021-09-02: qty 30, 30d supply, fill #0

## 2021-06-21 NOTE — Telephone Encounter (Signed)
[facility] and stated [facility] 20 [facility] and don't know when they will get it want it sent to [facility] [place] .

## 2021-06-21 NOTE — Telephone Encounter (Signed)
Done

## 2021-06-24 ENCOUNTER — Other Ambulatory Visit (HOSPITAL_BASED_OUTPATIENT_CLINIC_OR_DEPARTMENT_OTHER): Payer: Self-pay

## 2021-07-02 ENCOUNTER — Telehealth (INDEPENDENT_AMBULATORY_CARE_PROVIDER_SITE_OTHER): Payer: Commercial Managed Care - PPO | Admitting: Family Medicine

## 2021-07-02 ENCOUNTER — Encounter: Payer: Self-pay | Admitting: Family Medicine

## 2021-07-02 VITALS — Ht 65.0 in | Wt 150.5 lb

## 2021-07-02 DIAGNOSIS — M5440 Lumbago with sciatica, unspecified side: Secondary | ICD-10-CM

## 2021-07-02 DIAGNOSIS — G8929 Other chronic pain: Secondary | ICD-10-CM | POA: Diagnosis not present

## 2021-07-02 DIAGNOSIS — F1190 Opioid use, unspecified, uncomplicated: Secondary | ICD-10-CM | POA: Diagnosis not present

## 2021-07-02 MED ORDER — OXYCODONE-ACETAMINOPHEN 10-325 MG PO TABS: 1.0000 | ORAL_TABLET | Freq: Four times a day (QID) | ORAL | 0 refills | Status: DC | PRN

## 2021-07-02 NOTE — Progress Notes (Signed)
   Subjective:    Patient ID: James Bradley, male    DOB: [birth year], 51 y.o.   MRN: [id number]  HPI Virtual Visit via Telephone Note  I connected with the patient on 07/02/21 at  3:15 PM EDT by telephone and verified that I am speaking with the correct person using two identifiers.   I discussed the limitations, risks, security and privacy concerns of performing an evaluation and management service by telephone and the availability of in person appointments. I also discussed with the patient that there may be a patient responsible charge related to this service. The patient expressed understanding and agreed to proceed.  Location patient: home Location provider: work or home office Participants present for the call: patient, provider Patient did not have a visit in the prior 7 days to address this/these issue(s).   History of Present Illness: Here for pain management. He is doing well.    Observations/Objective: Patient sounds cheerful and well on the phone. I do not appreciate any SOB. Speech and thought processing are grossly intact. Patient reported vitals:  Assessment and Plan: Pain management.  Indication for chronic opioid: low back pain Medication and dose: Percocet 10-325 # pills per month: 120 Last UDS date: 02-06-21 Opioid Treatment Agreement signed (Y/N): 04-19-17 Opioid Treatment Agreement last reviewed with patient:  07-02-21 [place] reviewed this encounter (include red flags): Yes Meds were refilled.  Alysia Penna, MD   Follow Up Instructions:     [phone] 5-10 [phone] 11-20 [phone] 21-30 I did not refer this patient for an OV in the next 24 hours for this/these issue(s).  I discussed the assessment and treatment plan with the patient. The patient was provided an opportunity to ask questions and all were answered. The patient agreed with the plan and demonstrated an understanding of the instructions.   The patient was advised to call back or seek an in-person  evaluation if the symptoms worsen or if the condition fails to improve as anticipated.  I provided 14 minutes of non-face-to-face time during this encounter.   Alysia Penna, MD     Review of Systems     Objective:   Physical Exam        Assessment & Plan:

## 2021-07-08 ENCOUNTER — Telehealth: Payer: Self-pay | Admitting: Family Medicine

## 2021-07-08 ENCOUNTER — Encounter: Payer: Self-pay | Admitting: Family Medicine

## 2021-07-08 ENCOUNTER — Other Ambulatory Visit (HOSPITAL_BASED_OUTPATIENT_CLINIC_OR_DEPARTMENT_OTHER): Payer: Self-pay

## 2021-07-08 MED ORDER — OXYCODONE-ACETAMINOPHEN 10-325 MG PO TABS: 1.0000 | ORAL_TABLET | Freq: Four times a day (QID) | ORAL | 0 refills | Status: DC | PRN

## 2021-07-08 NOTE — Telephone Encounter (Addendum)
Patient sent in My Chart message around 8am today requesting Oxycodone to be sent to [facility] [address] [facility].   My Chart message was sent  at that time to Dr. Caryl Never.   Dr. Caryl Never sent medication in today Oxycodone  with incorrect date to [facility].    Message resent to Dr. Caryl Never asking for corrected date.

## 2021-07-08 NOTE — Telephone Encounter (Signed)
Dr. Claris Che patient.

## 2021-07-08 NOTE — Telephone Encounter (Signed)
Pt called again to state that the date on the medication is incorrect. It was supposed to have the date of 07/08/2021, and not 09/06/2021. Pt is going out of town this evening.  Please advise.   Please call him back at [phone], if you need any additional information.

## 2021-07-08 NOTE — Telephone Encounter (Signed)
James Bradley from [facility] calling stating info is still incorrect on prescription she can be reached at [phone]

## 2021-07-08 NOTE — Telephone Encounter (Signed)
I sent in prescription to requested pharmacy but we need to make sure the first prescription is canceled

## 2021-07-09 ENCOUNTER — Other Ambulatory Visit (HOSPITAL_BASED_OUTPATIENT_CLINIC_OR_DEPARTMENT_OTHER): Payer: Self-pay

## 2021-07-09 ENCOUNTER — Telehealth: Payer: Self-pay | Admitting: Family Medicine

## 2021-07-09 ENCOUNTER — Other Ambulatory Visit: Payer: Self-pay | Admitting: Family Medicine

## 2021-07-09 MED ORDER — OXYCODONE-ACETAMINOPHEN 10-325 MG PO TABS: 1.0000 | ORAL_TABLET | Freq: Four times a day (QID) | ORAL | 0 refills | Status: DC | PRN

## 2021-07-09 MED ORDER — OXYCODONE-ACETAMINOPHEN 10-325 MG PO TABS
1.0000 | ORAL_TABLET | Freq: Four times a day (QID) | ORAL | 0 refills | Status: DC | PRN
  Filled 2021-07-09: qty 120, 30d supply, fill #0

## 2021-07-09 NOTE — Telephone Encounter (Signed)
See My Chart message form 07/08/21.

## 2021-07-09 NOTE — Telephone Encounter (Signed)
Please give patient a call regarding his prescription for oxyCODONE-acetaminophen (PERCOCET) 10-325 MG tablet. Stating due to his pain management this prescription should be filled this month but it is being written for august. [phone]

## 2021-07-09 NOTE — Telephone Encounter (Signed)
Message complete.  See previous phone message from 07/08/21

## 2021-07-09 NOTE — Telephone Encounter (Signed)
Called [facility] at [address] fill date was 09/06/21.    My Chart message from 07/08/21 was sent to Dr. Caryl Never requesting correction.   On 07/09/21, Dr. Caryl Never sent corrected fill date for Oxycodone to [facility]. Called patient made aware of corrected date on medication sent.

## 2021-07-09 NOTE — Telephone Encounter (Signed)
 This has been recent.

## 2021-07-22 ENCOUNTER — Other Ambulatory Visit (HOSPITAL_BASED_OUTPATIENT_CLINIC_OR_DEPARTMENT_OTHER): Payer: Self-pay

## 2021-07-29 ENCOUNTER — Other Ambulatory Visit (HOSPITAL_BASED_OUTPATIENT_CLINIC_OR_DEPARTMENT_OTHER): Payer: Self-pay

## 2021-07-31 ENCOUNTER — Other Ambulatory Visit (HOSPITAL_BASED_OUTPATIENT_CLINIC_OR_DEPARTMENT_OTHER): Payer: Self-pay

## 2021-07-31 ENCOUNTER — Other Ambulatory Visit: Payer: Self-pay | Admitting: Family Medicine

## 2021-08-01 ENCOUNTER — Other Ambulatory Visit (HOSPITAL_BASED_OUTPATIENT_CLINIC_OR_DEPARTMENT_OTHER): Payer: Self-pay

## 2021-08-01 MED ORDER — OXYCODONE-ACETAMINOPHEN 10-325 MG PO TABS
1.0000 | ORAL_TABLET | Freq: Four times a day (QID) | ORAL | 0 refills | Status: DC | PRN
  Filled 2021-08-01 – 2021-08-06 (×2): qty 120, 30d supply, fill #0

## 2021-08-01 NOTE — Telephone Encounter (Signed)
Done

## 2021-08-01 NOTE — Telephone Encounter (Signed)
Last VV---07/02/21 Last refill- 07/09/21--120 tabs, 0 refills  No future OV scheduled.

## 2021-08-06 ENCOUNTER — Other Ambulatory Visit (HOSPITAL_BASED_OUTPATIENT_CLINIC_OR_DEPARTMENT_OTHER): Payer: Self-pay

## 2021-08-22 ENCOUNTER — Other Ambulatory Visit (HOSPITAL_BASED_OUTPATIENT_CLINIC_OR_DEPARTMENT_OTHER): Payer: Self-pay

## 2021-08-28 ENCOUNTER — Other Ambulatory Visit (HOSPITAL_BASED_OUTPATIENT_CLINIC_OR_DEPARTMENT_OTHER): Payer: Self-pay

## 2021-08-28 ENCOUNTER — Encounter: Payer: Self-pay | Admitting: Family Medicine

## 2021-08-28 ENCOUNTER — Ambulatory Visit (INDEPENDENT_AMBULATORY_CARE_PROVIDER_SITE_OTHER): Payer: Commercial Managed Care - PPO | Admitting: Family Medicine

## 2021-08-28 VITALS — BP 118/70 | HR 79 | Temp 98.7°F | Wt 149.2 lb

## 2021-08-28 DIAGNOSIS — M5440 Lumbago with sciatica, unspecified side: Secondary | ICD-10-CM | POA: Diagnosis not present

## 2021-08-28 DIAGNOSIS — F1190 Opioid use, unspecified, uncomplicated: Secondary | ICD-10-CM | POA: Diagnosis not present

## 2021-08-28 DIAGNOSIS — G8929 Other chronic pain: Secondary | ICD-10-CM

## 2021-08-28 MED ORDER — OXYCODONE-ACETAMINOPHEN 10-325 MG PO TABS: 1.0000 | ORAL_TABLET | Freq: Four times a day (QID) | ORAL | 0 refills | Status: DC | PRN

## 2021-08-28 NOTE — Progress Notes (Unsigned)
   Subjective:    Patient ID: James Bradley, male    DOB: 04-07-1970, 51 y.o.   MRN: [id number]  HPI Here for pain management. His back pain is stable. 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    Review of Systems  Constitutional: Negative.   Musculoskeletal:  Positive for back pain.       Objective:   Physical Exam Constitutional:      Appearance: Normal appearance.  Neurological:     Mental Status: He is alert.           Assessment & Plan:  Pain management. Indication for chronic opioid: low back pain Medication and dose: Percocet 10-325  # pills per month: 120 Last urine drug screen date: 02-06-21 Opioid Treatment Agreement signed (Y/N): 04-19-17 Opioid Treatment Agreement last reviewed with patient:  08-28-21 NCCSRS reviewed this encounter (include red flags): Yes Meds were refilled.  Alysia Penna, MD

## 2021-09-02 ENCOUNTER — Other Ambulatory Visit (HOSPITAL_BASED_OUTPATIENT_CLINIC_OR_DEPARTMENT_OTHER): Payer: Self-pay

## 2021-09-02 ENCOUNTER — Telehealth: Payer: Self-pay | Admitting: Family Medicine

## 2021-09-02 MED ORDER — OXYCODONE-ACETAMINOPHEN 10-325 MG PO TABS
1.0000 | ORAL_TABLET | Freq: Four times a day (QID) | ORAL | 0 refills | Status: DC | PRN
  Filled 2021-09-04: qty 120, 30d supply, fill #0

## 2021-09-02 NOTE — Telephone Encounter (Signed)
Pt pharmacy states that they did not receive oxycodone refill for 08/23, please advise

## 2021-09-02 NOTE — Telephone Encounter (Signed)
Pt called to say this start date must be changed to an August 2023 start date.  oxyCODONE-acetaminophen (PERCOCET) 10-325 MG tablet 120 tablet 0 11/05/2021 12/05/2021   Please advise.  [facility] Phone:  [phone]  Fax:  [phone]

## 2021-09-02 NOTE — Telephone Encounter (Signed)
Done

## 2021-09-03 ENCOUNTER — Other Ambulatory Visit (HOSPITAL_BASED_OUTPATIENT_CLINIC_OR_DEPARTMENT_OTHER): Payer: Self-pay

## 2021-09-03 NOTE — Telephone Encounter (Signed)
Called pharmacy, spoke with Hailey corrected prescription for Oxycodone 10-325 mg was received on 09/02/21.    Scheduled to be refilled on 09/04/21, Hailey stated that patient is aware.

## 2021-09-04 ENCOUNTER — Other Ambulatory Visit (HOSPITAL_BASED_OUTPATIENT_CLINIC_OR_DEPARTMENT_OTHER): Payer: Self-pay

## 2021-09-26 ENCOUNTER — Other Ambulatory Visit: Payer: Self-pay | Admitting: Family Medicine

## 2021-09-27 ENCOUNTER — Other Ambulatory Visit (HOSPITAL_BASED_OUTPATIENT_CLINIC_OR_DEPARTMENT_OTHER): Payer: Self-pay

## 2021-09-27 ENCOUNTER — Encounter (HOSPITAL_BASED_OUTPATIENT_CLINIC_OR_DEPARTMENT_OTHER): Payer: Self-pay | Admitting: Pharmacist

## 2021-09-27 MED ORDER — AMPHETAMINE-DEXTROAMPHETAMINE 20 MG PO TABS: 20.0000 mg | ORAL_TABLET | Freq: Every day | ORAL | 0 refills | Status: DC

## 2021-09-27 MED ORDER — AMPHETAMINE-DEXTROAMPHETAMINE 20 MG PO TABS
20.0000 mg | ORAL_TABLET | Freq: Every day | ORAL | 0 refills | Status: DC
Start: 2021-10-04 — End: 2021-09-27

## 2021-09-27 NOTE — Telephone Encounter (Signed)
Last OV- 08-28-21 Last refill-06-21-21, 3 refills  No future OV scheduled.

## 2021-09-27 NOTE — Telephone Encounter (Signed)
These were printed out so he can pick them up

## 2021-09-30 ENCOUNTER — Telehealth: Payer: Self-pay

## 2021-09-30 ENCOUNTER — Other Ambulatory Visit (HOSPITAL_BASED_OUTPATIENT_CLINIC_OR_DEPARTMENT_OTHER): Payer: Self-pay

## 2021-09-30 MED ORDER — OXYCODONE-ACETAMINOPHEN 10-325 MG PO TABS
1.0000 | ORAL_TABLET | Freq: Four times a day (QID) | ORAL | 0 refills | Status: DC | PRN
  Filled 2021-10-07: qty 120, 30d supply, fill #0

## 2021-09-30 NOTE — Addendum Note (Signed)
Addended by: Alysia Penna A on: 09/30/2021 12:30 PM   Modules accepted: Orders

## 2021-09-30 NOTE — Telephone Encounter (Signed)
Done

## 2021-09-30 NOTE — Telephone Encounter (Signed)
Pt request sent to Dr Fry for advise 

## 2021-10-07 ENCOUNTER — Other Ambulatory Visit (HOSPITAL_BASED_OUTPATIENT_CLINIC_OR_DEPARTMENT_OTHER): Payer: Self-pay

## 2021-10-25 IMAGING — DX DG HIP (WITH OR WITHOUT PELVIS) 3-4V BILAT
5 series · 5 of 5 positions shown · non-contrast
Comparison: None.

CLINICAL DATA: Left-sided hip pain

EXAM:
DG HIP (WITH OR WITHOUT PELVIS) 3-4V BILAT

[pelvis ap]
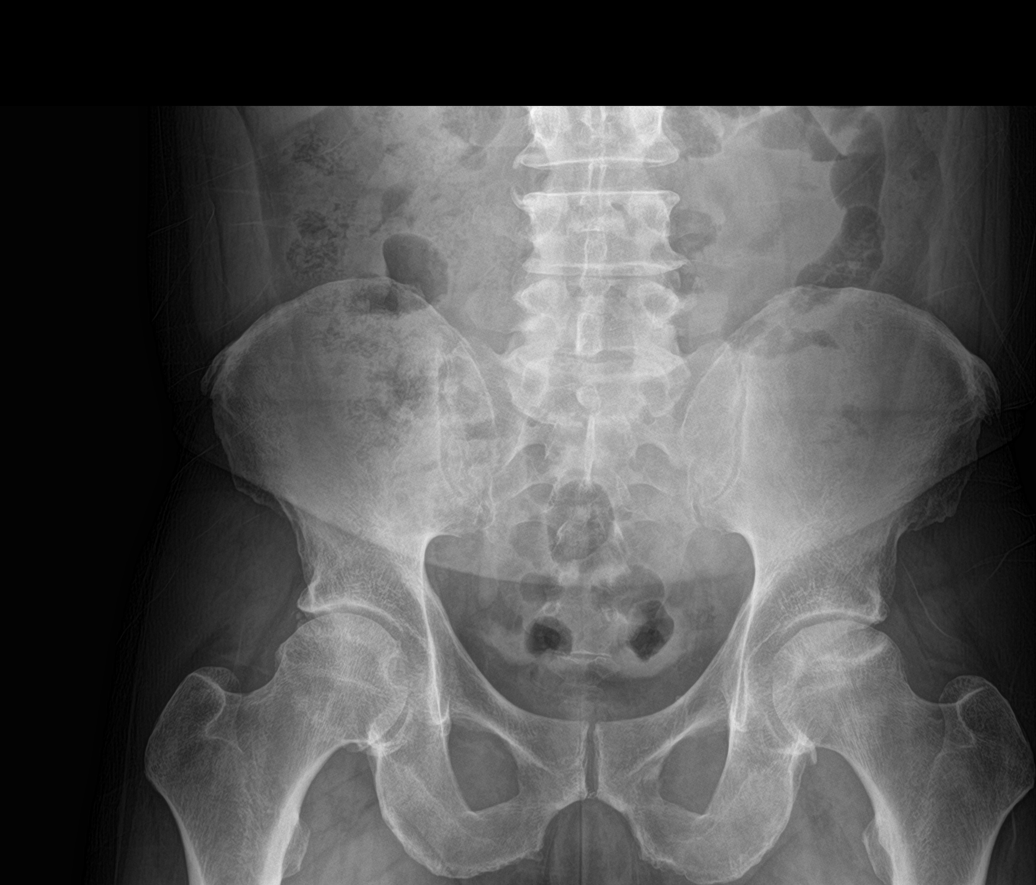

[hip ap (1 of 2)]
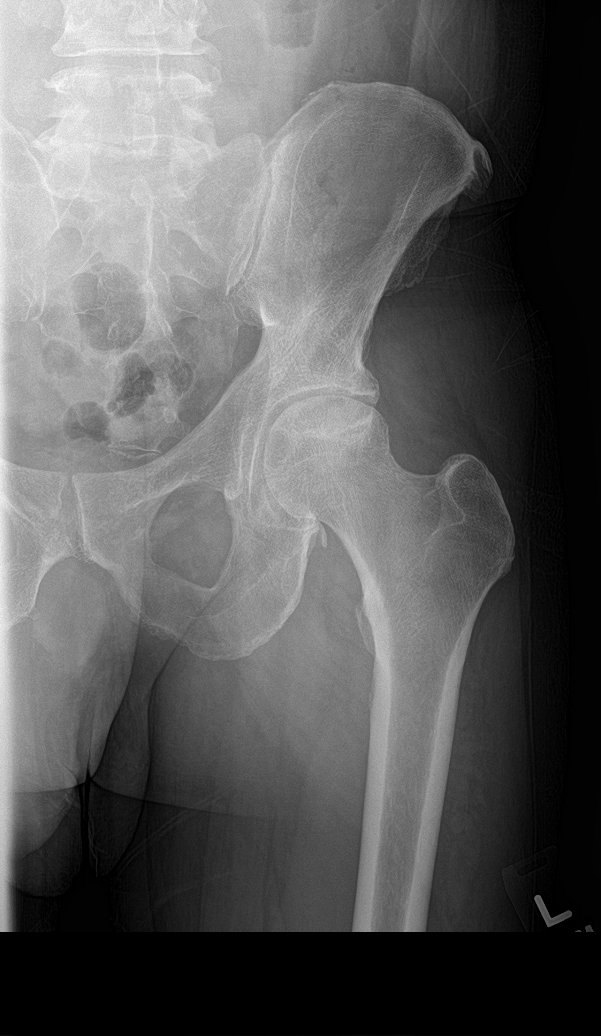

[hip lat (1 of 2)]
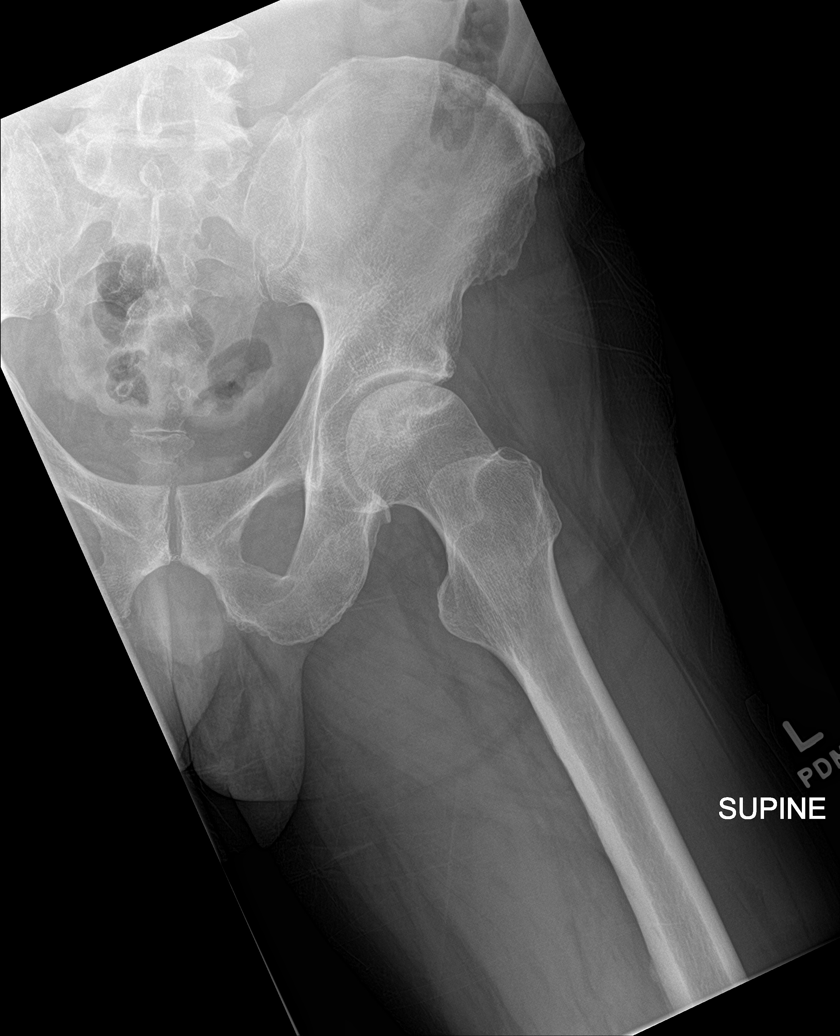

[hip ap (2 of 2)]
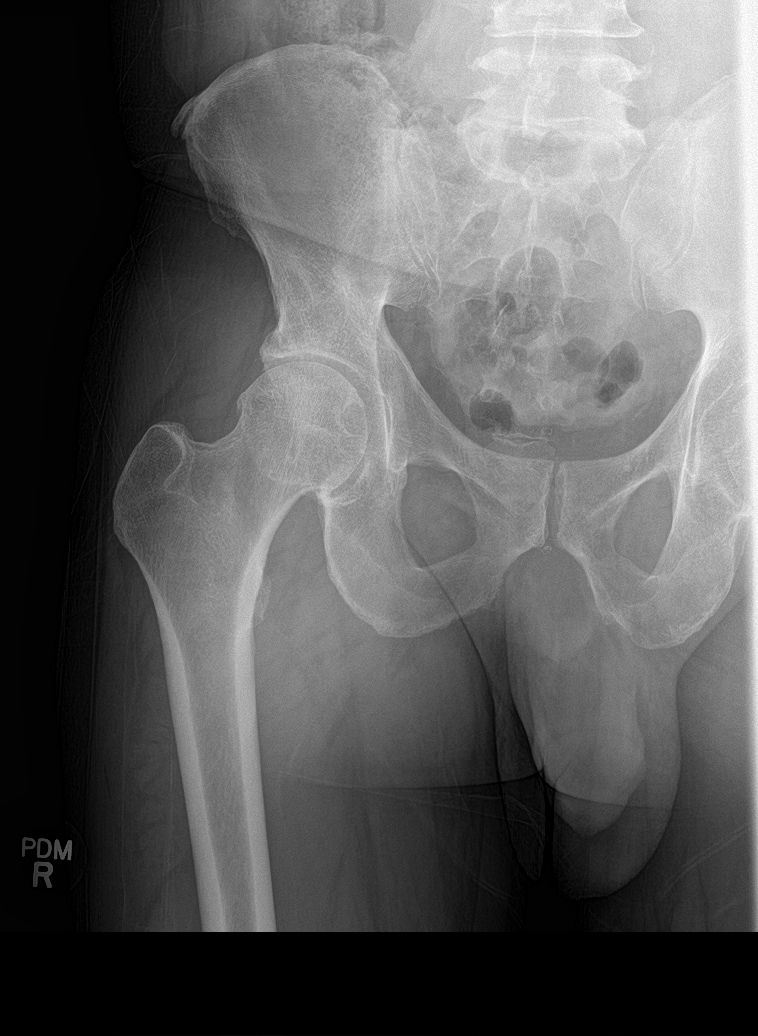

[hip lat (2 of 2)]
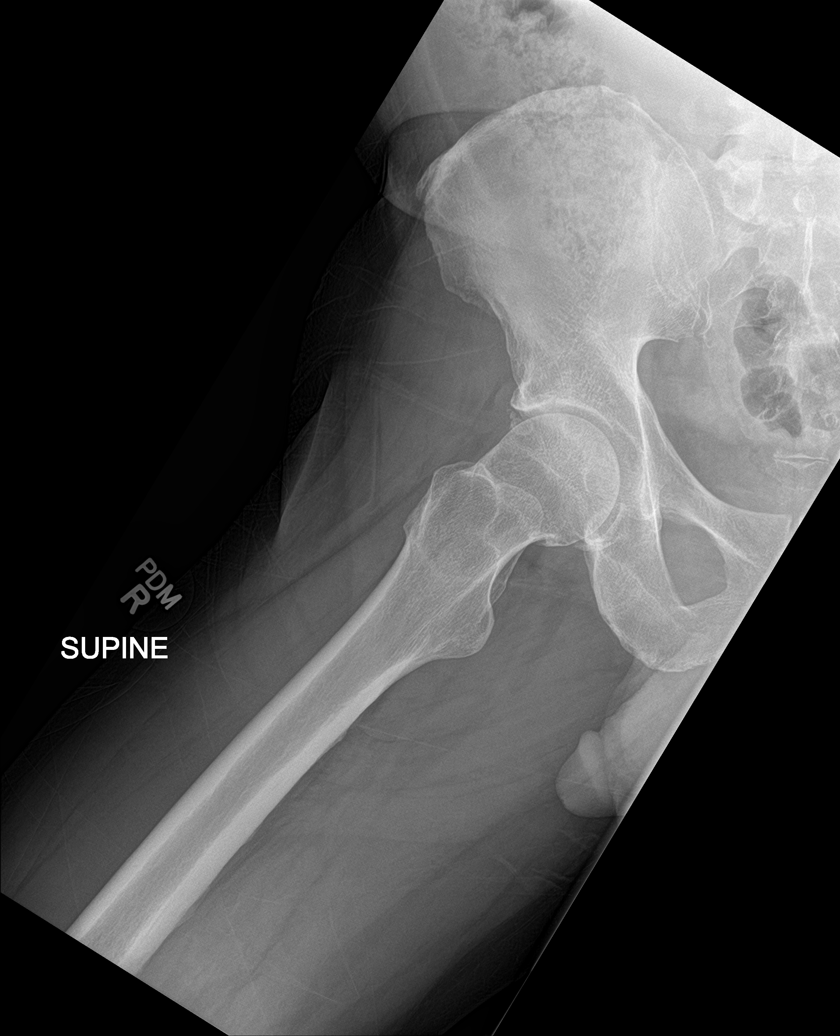

[5 of 5 positions shown; findings below may reference images not displayed]

FINDINGS: SI joints are patent. Pubic symphysis and rami appear intact.
Lucency and sclerosis within the subarticular left femoral head
suggestive of AVN. No collapse. Mild to moderate degenerative
changes of both hips.
IMPRESSION: Possible AVN of the left femoral head. Mild to moderate degenerative
changes of both hips.

## 2021-10-28 ENCOUNTER — Other Ambulatory Visit: Payer: Self-pay | Admitting: Family Medicine

## 2021-10-28 NOTE — Telephone Encounter (Signed)
Pt LOV was on 08/28/21 Last refill done on 05/07/21 Please advise

## 2021-11-01 ENCOUNTER — Other Ambulatory Visit: Payer: Self-pay | Admitting: Family Medicine

## 2021-11-01 ENCOUNTER — Other Ambulatory Visit (HOSPITAL_BASED_OUTPATIENT_CLINIC_OR_DEPARTMENT_OTHER): Payer: Self-pay

## 2021-11-01 NOTE — Telephone Encounter (Signed)
Last OV- 08/28/21 Last refill-10/05/21--120 tabs, 0 refills  No future Ov scheduled.

## 2021-11-04 ENCOUNTER — Other Ambulatory Visit (HOSPITAL_BASED_OUTPATIENT_CLINIC_OR_DEPARTMENT_OTHER): Payer: Self-pay

## 2021-11-04 MED ORDER — OXYCODONE-ACETAMINOPHEN 10-325 MG PO TABS
1.0000 | ORAL_TABLET | Freq: Four times a day (QID) | ORAL | 0 refills | Status: DC | PRN
  Filled 2021-11-04: qty 120, 30d supply, fill #0

## 2021-11-04 NOTE — Telephone Encounter (Signed)
Pt called to say the pharmacy is still waiting for the approval for the oxyCODONE-acetaminophen (PERCOCET) 10-325 MG tablet

## 2021-11-04 NOTE — Telephone Encounter (Signed)
Done

## 2021-11-29 ENCOUNTER — Other Ambulatory Visit (HOSPITAL_BASED_OUTPATIENT_CLINIC_OR_DEPARTMENT_OTHER): Payer: Self-pay

## 2021-12-02 ENCOUNTER — Other Ambulatory Visit (HOSPITAL_BASED_OUTPATIENT_CLINIC_OR_DEPARTMENT_OTHER): Payer: Self-pay

## 2021-12-02 ENCOUNTER — Telehealth: Payer: Self-pay | Admitting: Family Medicine

## 2021-12-02 MED ORDER — OXYCODONE-ACETAMINOPHEN 10-325 MG PO TABS
1.0000 | ORAL_TABLET | Freq: Four times a day (QID) | ORAL | 0 refills | Status: DC | PRN
  Filled 2021-12-02: qty 120, 30d supply, fill #0

## 2021-12-02 NOTE — Telephone Encounter (Signed)
Pt LOV was on 08/28/21 Last refill done on 11/04/21 Please advise

## 2021-12-02 NOTE — Telephone Encounter (Signed)
Pt calling to check progress of this refill.

## 2021-12-02 NOTE — Telephone Encounter (Signed)
Spoke with Hailey at [facility] and she stated that they do Not have a prescription on file for Oxycodone

## 2021-12-02 NOTE — Telephone Encounter (Signed)
Please call the pharmacy to okay an early refill

## 2021-12-02 NOTE — Telephone Encounter (Signed)
I sent in a 30 day supply of the Oxycodone, but he is due for a PMV soon

## 2021-12-02 NOTE — Telephone Encounter (Signed)
Pt is calling and would like one day early refill he is going out of town oxyCODONE-acetaminophen ([place]) 10-325 MG tablet  [facility] Phone: [phone]  Fax: [phone]

## 2021-12-03 NOTE — Telephone Encounter (Signed)
Spoke with pt verbalized understanding, scheduled virtual app for 12/09/21

## 2021-12-09 ENCOUNTER — Encounter: Payer: Self-pay | Admitting: Family Medicine

## 2021-12-09 ENCOUNTER — Telehealth (INDEPENDENT_AMBULATORY_CARE_PROVIDER_SITE_OTHER): Payer: Self-pay | Admitting: Family Medicine

## 2021-12-09 ENCOUNTER — Other Ambulatory Visit (HOSPITAL_BASED_OUTPATIENT_CLINIC_OR_DEPARTMENT_OTHER): Payer: Self-pay

## 2021-12-09 DIAGNOSIS — M5440 Lumbago with sciatica, unspecified side: Secondary | ICD-10-CM

## 2021-12-09 DIAGNOSIS — G8929 Other chronic pain: Secondary | ICD-10-CM

## 2021-12-09 DIAGNOSIS — F1190 Opioid use, unspecified, uncomplicated: Secondary | ICD-10-CM

## 2021-12-09 MED ORDER — OXYCODONE-ACETAMINOPHEN 10-325 MG PO TABS: 1.0000 | ORAL_TABLET | Freq: Four times a day (QID) | ORAL | 0 refills | Status: DC | PRN

## 2021-12-09 MED ORDER — ZOLPIDEM TARTRATE 10 MG PO TABS
10.0000 mg | ORAL_TABLET | Freq: Every evening | ORAL | 5 refills | Status: DC | PRN
  Filled 2021-12-09 – 2021-12-26 (×2): qty 30, 30d supply, fill #0
  Filled 2022-02-18: qty 15, 15d supply, fill #1
  Filled 2022-04-28: qty 15, 15d supply, fill #2
  Filled 2022-06-06: qty 15, 15d supply, fill #3

## 2021-12-09 NOTE — Progress Notes (Signed)
Subjective:    Patient ID: James Bradley, male    DOB: 05-21-1970, 51 y.o.   MRN: [id number]  HPI Virtual Visit via Video Note  I connected with the patient on 12/09/21 at  2:45 PM EST by a video enabled telemedicine application and verified that I am speaking with the correct person using two identifiers.  Location patient: home Location provider:work or home office Persons participating in the virtual visit: patient, provider  I discussed the limitations of evaluation and management by telemedicine and the availability of in person appointments. The patient expressed understanding and agreed to proceed.   HPI: Here for pain management. He is doing well, although he has had more back pain than usual this past week from clearing leaves from his yard.    ROS: See pertinent positives and negatives per HPI.  Past Medical History:  Diagnosis Date   Anxiety    Cancer [facility])    prostate , sees Dr. Jeffie Pollock    GERD (gastroesophageal reflux disease)    Herpes labialis    Insomnia    Low back pain    Onychomycosis    Prostatitis     Past Surgical History:  Procedure Laterality Date   COLONOSCOPY  August 2013    per Dr. Carol Ada , clear, repeat in 10 yrs    ESI Dr Lorin Mercy     microdiskectomy  2009   at L5-S1, per Dr. Kerby Nora    PROSTATECTOMY  08-20-10   per Dr. Jeffie Pollock    Family History  Problem Relation Age of Onset   Arthritis Other    Diabetes Other      Current Outpatient Medications:    ALPRAZolam (XANAX) 1 MG tablet, TAKE 1 TABLET (1 MG TOTAL) BY MOUTH 3 (THREE) TIMES DAILY AS NEEDED. FOR ANXIETY, Disp: 90 tablet, Rfl: 5   amphetamine-dextroamphetamine (ADDERALL) 20 MG tablet, Take 1 tablet (20 mg total) by mouth daily., Disp: 30 tablet, Rfl: 0   levothyroxine (SYNTHROID) 75 MCG tablet, TAKE 1 TABLET BY MOUTH EVERY DAY, Disp: 90 tablet, Rfl: 3   omeprazole (PRILOSEC) 40 MG capsule, Take 1 capsule (40 mg total) by mouth daily., Disp: 90 capsule, Rfl: 3    sildenafil (VIAGRA) 100 MG tablet, Take 1 tablet (100 mg total) by mouth as needed for erectile dysfunction., Disp: 10 tablet, Rfl: 11   valACYclovir (VALTREX) 500 MG tablet, Take 1 tablet (500 mg total) by mouth 2 (two) times daily as needed (cold sores)., Disp: 60 tablet, Rfl: 11   [START ON 01/01/2022] oxyCODONE-acetaminophen (PERCOCET) 10-325 MG tablet, Take 1 tablet by mouth every 6 (six) hours as needed for pain., Disp: 120 tablet, Rfl: 0   zolpidem (AMBIEN) 10 MG tablet, Take 1 tablet (10 mg total) by mouth at bedtime as needed for sleep. for sleep, Disp: 30 tablet, Rfl: 5  EXAM:  VITALS per patient if applicable:  GENERAL: alert, oriented, appears well and in no acute distress  HEENT: atraumatic, conjunttiva clear, no obvious abnormalities on inspection of external nose and ears  NECK: normal movements of the head and neck  LUNGS: on inspection no signs of respiratory distress, breathing rate appears normal, no obvious gross SOB, gasping or wheezing  CV: no obvious cyanosis  MS: moves all visible extremities without noticeable abnormality  PSYCH/NEURO: pleasant and cooperative, no obvious depression or anxiety, speech and thought processing grossly intact  ASSESSMENT AND PLAN: Pain management. Indication for chronic opioid: low back pain Medication and dose: Percocet 10-325 # pills  per month: 120 Last UDS date: 02-06-21 Opioid Treatment Agreement signed (Y/N): 04-19-17  Opioid Treatment Agreement last reviewed with patient:  12-09-21 NCCSRS reviewed this encounter (include red flags): Yes Meds were refilled.  Alysia Penna, MD  Discussed the following assessment and plan:  No diagnosis found.     I discussed the assessment and treatment plan with the patient. The patient was provided an opportunity to ask questions and all were answered. The patient agreed with the plan and demonstrated an understanding of the instructions.   The patient was advised to call back or seek  an in-person evaluation if the symptoms worsen or if the condition fails to improve as anticipated.      Review of Systems     Objective:   Physical Exam        Assessment & Plan:

## 2021-12-13 ENCOUNTER — Other Ambulatory Visit (HOSPITAL_BASED_OUTPATIENT_CLINIC_OR_DEPARTMENT_OTHER): Payer: Self-pay

## 2021-12-13 DIAGNOSIS — Z419 Encounter for procedure for purposes other than remedying health state, unspecified: Secondary | ICD-10-CM | POA: Diagnosis not present

## 2021-12-19 ENCOUNTER — Other Ambulatory Visit (HOSPITAL_BASED_OUTPATIENT_CLINIC_OR_DEPARTMENT_OTHER): Payer: Self-pay

## 2021-12-23 ENCOUNTER — Other Ambulatory Visit (HOSPITAL_BASED_OUTPATIENT_CLINIC_OR_DEPARTMENT_OTHER): Payer: Self-pay

## 2021-12-23 ENCOUNTER — Telehealth: Payer: Self-pay | Admitting: Family Medicine

## 2021-12-23 NOTE — Telephone Encounter (Signed)
Pt states the start date for his oxyCODONE-acetaminophen (PERCOCET) 10-325 MG tablet should be sooner than 03/04/2022

## 2021-12-24 ENCOUNTER — Other Ambulatory Visit (HOSPITAL_BASED_OUTPATIENT_CLINIC_OR_DEPARTMENT_OTHER): Payer: Self-pay

## 2021-12-24 MED ORDER — OXYCODONE-ACETAMINOPHEN 10-325 MG PO TABS
1.0000 | ORAL_TABLET | Freq: Four times a day (QID) | ORAL | 0 refills | Status: DC | PRN
  Filled 2022-01-01: qty 120, 30d supply, fill #0

## 2021-12-24 NOTE — Telephone Encounter (Signed)
That's because the [facility] pharmacy can only accept one RX at a time. I just sent in a refill dated 01-01-22

## 2021-12-24 NOTE — Addendum Note (Signed)
Addended by: Alysia Penna A on: 12/24/2021 12:52 PM   Modules accepted: Orders

## 2021-12-26 ENCOUNTER — Other Ambulatory Visit (HOSPITAL_BASED_OUTPATIENT_CLINIC_OR_DEPARTMENT_OTHER): Payer: Self-pay

## 2021-12-26 MED ORDER — AMPHETAMINE-DEXTROAMPHETAMINE 20 MG PO TABS: 20.0000 mg | ORAL_TABLET | Freq: Every day | ORAL | 0 refills | Status: DC

## 2021-12-26 MED ORDER — AMPHETAMINE-DEXTROAMPHETAMINE 20 MG PO TABS
20.0000 mg | ORAL_TABLET | Freq: Every day | ORAL | 0 refills | Status: DC
  Filled 2021-12-26: qty 30, 30d supply, fill #0

## 2021-12-26 NOTE — Telephone Encounter (Signed)
We got a fax from [facility] that said their records show the start date for his next [place] is 01-02-23 but this is wrong. His chart clearly shows the start date to be 01-01-22. Please call them to straighten this out

## 2021-12-27 ENCOUNTER — Other Ambulatory Visit (HOSPITAL_BASED_OUTPATIENT_CLINIC_OR_DEPARTMENT_OTHER): Payer: Self-pay

## 2021-12-27 NOTE — Telephone Encounter (Signed)
Spoke with pt pharmacy states that they have current refill which is due to be filled on 01/01/22. Sent a MyChart message to pt

## 2022-01-01 ENCOUNTER — Other Ambulatory Visit (HOSPITAL_BASED_OUTPATIENT_CLINIC_OR_DEPARTMENT_OTHER): Payer: Self-pay

## 2022-01-13 DIAGNOSIS — Z419 Encounter for procedure for purposes other than remedying health state, unspecified: Secondary | ICD-10-CM | POA: Diagnosis not present

## 2022-01-17 ENCOUNTER — Encounter: Payer: Self-pay | Admitting: Family Medicine

## 2022-01-22 ENCOUNTER — Other Ambulatory Visit: Payer: Self-pay | Admitting: Family Medicine

## 2022-01-23 ENCOUNTER — Other Ambulatory Visit (HOSPITAL_BASED_OUTPATIENT_CLINIC_OR_DEPARTMENT_OTHER): Payer: Self-pay

## 2022-01-23 MED ORDER — OXYCODONE-ACETAMINOPHEN 10-325 MG PO TABS
1.0000 | ORAL_TABLET | Freq: Four times a day (QID) | ORAL | 0 refills | Status: DC | PRN
  Filled 2022-01-23 – 2022-01-29 (×2): qty 120, 30d supply, fill #0

## 2022-01-24 ENCOUNTER — Other Ambulatory Visit (HOSPITAL_BASED_OUTPATIENT_CLINIC_OR_DEPARTMENT_OTHER): Payer: Self-pay

## 2022-01-28 ENCOUNTER — Other Ambulatory Visit (HOSPITAL_BASED_OUTPATIENT_CLINIC_OR_DEPARTMENT_OTHER): Payer: Self-pay

## 2022-01-29 ENCOUNTER — Other Ambulatory Visit (HOSPITAL_BASED_OUTPATIENT_CLINIC_OR_DEPARTMENT_OTHER): Payer: Self-pay

## 2022-02-03 ENCOUNTER — Other Ambulatory Visit (HOSPITAL_BASED_OUTPATIENT_CLINIC_OR_DEPARTMENT_OTHER): Payer: Self-pay

## 2022-02-03 ENCOUNTER — Ambulatory Visit (INDEPENDENT_AMBULATORY_CARE_PROVIDER_SITE_OTHER): Payer: Medicaid Other | Admitting: Family Medicine

## 2022-02-03 ENCOUNTER — Encounter: Payer: Self-pay | Admitting: Family Medicine

## 2022-02-03 VITALS — BP 124/82 | HR 91 | Temp 97.9°F | Wt 160.4 lb

## 2022-02-03 DIAGNOSIS — Z23 Encounter for immunization: Secondary | ICD-10-CM

## 2022-02-03 DIAGNOSIS — F1190 Opioid use, unspecified, uncomplicated: Secondary | ICD-10-CM

## 2022-02-03 DIAGNOSIS — M5440 Lumbago with sciatica, unspecified side: Secondary | ICD-10-CM

## 2022-02-03 DIAGNOSIS — G8929 Other chronic pain: Secondary | ICD-10-CM

## 2022-02-03 MED ORDER — OXYCODONE-ACETAMINOPHEN 10-325 MG PO TABS
1.0000 | ORAL_TABLET | Freq: Four times a day (QID) | ORAL | 0 refills | Status: DC | PRN
  Filled 2022-03-01 (×2): qty 120, 30d supply, fill #0

## 2022-02-03 MED ORDER — AMPHETAMINE-DEXTROAMPHETAMINE 20 MG PO TABS
20.0000 mg | ORAL_TABLET | Freq: Every day | ORAL | 0 refills | Status: DC
  Filled 2022-02-03: qty 30, 30d supply, fill #0

## 2022-02-03 NOTE — Addendum Note (Signed)
Addended by: Wyvonne Lenz on: 02/03/2022 02:01 PM   Modules accepted: Orders

## 2022-02-03 NOTE — Addendum Note (Signed)
Addended by: Wyvonne Lenz on: 02/03/2022 10:12 AM   Modules accepted: Orders

## 2022-02-03 NOTE — Addendum Note (Signed)
Addended by: Rosalyn Gess D on: 02/03/2022 03:38 PM   Modules accepted: Orders

## 2022-02-03 NOTE — Progress Notes (Signed)
   Subjective:    Patient ID: James Bradley, male    DOB: [birth year], 52 y.o.   MRN: [id number]  HPI Here for pain management. He is doing well.    Review of Systems  Constitutional: Negative.   Musculoskeletal:  Positive for back pain.       Objective:   Physical Exam Constitutional:      Appearance: Normal appearance.  Neurological:     Mental Status: He is alert.           Assessment & Plan:  Pain management.  Indication for chronic opioid: low back pain Medication and dose: Percocet 10-325 # pills per month: 120 Last UDS date: 02-03-22 Opioid Treatment Agreement signed (Y/N): 04-19-17 Opioid Treatment Agreement last reviewed with patient:  02-03-22 NCCSRS reviewed this encounter (include red flags): Yes Meds were refilled.  Alysia Penna, MD

## 2022-02-06 LAB — DRUG MONITOR, PANEL 1, W/CONF, URINE
Alphahydroxyalprazolam: 1113 ng/mL — ABNORMAL HIGH (ref <25)
Alphahydroxymidazolam: NEGATIVE ng/mL (ref <50)
Alphahydroxytriazolam: NEGATIVE ng/mL (ref <50)
Aminoclonazepam: NEGATIVE ng/mL (ref <25)
Amphetamine: 1838 ng/mL — ABNORMAL HIGH (ref <250)
Amphetamines: POSITIVE ng/mL — AB (ref <500)
Barbiturates: NEGATIVE ng/mL (ref <300)
Benzodiazepines: POSITIVE ng/mL — AB (ref <100)
Cocaine Metabolite: NEGATIVE ng/mL (ref <150)
Codeine: NEGATIVE ng/mL (ref <50)
Creatinine: 181.2 mg/dL (ref >=20.0)
Hydrocodone: NEGATIVE ng/mL (ref <50)
Hydromorphone: NEGATIVE ng/mL (ref <50)
Hydroxyethylflurazepam: NEGATIVE ng/mL (ref <50)
Lorazepam: NEGATIVE ng/mL (ref <50)
Marijuana Metabolite: NEGATIVE ng/mL (ref <20)
Methadone Metabolite: NEGATIVE ng/mL (ref <100)
Methamphetamine: NEGATIVE ng/mL (ref <250)
Morphine: NEGATIVE ng/mL (ref <50)
Nordiazepam: NEGATIVE ng/mL (ref <50)
Norhydrocodone: NEGATIVE ng/mL (ref <50)
Noroxycodone: >10000 ng/mL — ABNORMAL HIGH (ref <50)
Opiates: NEGATIVE ng/mL (ref <100)
Oxazepam: NEGATIVE ng/mL (ref <50)
Oxidant: NEGATIVE ug/mL (ref <200)
Oxycodone: 8010 ng/mL — ABNORMAL HIGH (ref <50)
Oxycodone: POSITIVE ng/mL — AB (ref <100)
Oxymorphone: 1604 ng/mL — ABNORMAL HIGH (ref <50)
Phencyclidine: NEGATIVE ng/mL (ref <25)
Temazepam: NEGATIVE ng/mL (ref <50)
pH: 5.2 (ref 4.5–9.0)

## 2022-02-06 LAB — DM TEMPLATE

## 2022-02-08 ENCOUNTER — Other Ambulatory Visit (HOSPITAL_BASED_OUTPATIENT_CLINIC_OR_DEPARTMENT_OTHER): Payer: Self-pay

## 2022-02-10 ENCOUNTER — Other Ambulatory Visit (HOSPITAL_COMMUNITY): Payer: Self-pay

## 2022-02-13 DIAGNOSIS — Z419 Encounter for procedure for purposes other than remedying health state, unspecified: Secondary | ICD-10-CM | POA: Diagnosis not present

## 2022-02-18 ENCOUNTER — Other Ambulatory Visit (HOSPITAL_BASED_OUTPATIENT_CLINIC_OR_DEPARTMENT_OTHER): Payer: Self-pay

## 2022-02-19 ENCOUNTER — Other Ambulatory Visit (HOSPITAL_BASED_OUTPATIENT_CLINIC_OR_DEPARTMENT_OTHER): Payer: Self-pay

## 2022-02-28 ENCOUNTER — Other Ambulatory Visit (HOSPITAL_BASED_OUTPATIENT_CLINIC_OR_DEPARTMENT_OTHER): Payer: Self-pay

## 2022-02-28 ENCOUNTER — Other Ambulatory Visit: Payer: Self-pay | Admitting: Family Medicine

## 2022-03-01 ENCOUNTER — Other Ambulatory Visit (HOSPITAL_BASED_OUTPATIENT_CLINIC_OR_DEPARTMENT_OTHER): Payer: Self-pay

## 2022-03-03 ENCOUNTER — Other Ambulatory Visit (HOSPITAL_BASED_OUTPATIENT_CLINIC_OR_DEPARTMENT_OTHER): Payer: Self-pay

## 2022-03-03 MED ORDER — AMPHETAMINE-DEXTROAMPHETAMINE 20 MG PO TABS
20.0000 mg | ORAL_TABLET | Freq: Every day | ORAL | 0 refills | Status: DC
  Filled 2022-03-03: qty 30, 30d supply, fill #0

## 2022-03-03 NOTE — Telephone Encounter (Signed)
Done

## 2022-03-03 NOTE — Telephone Encounter (Signed)
Last OV-02/03/22 Last refill-02/03/22--30 tabs, 0 refills  No future OV scheduled

## 2022-03-14 DIAGNOSIS — Z419 Encounter for procedure for purposes other than remedying health state, unspecified: Secondary | ICD-10-CM | POA: Diagnosis not present

## 2022-03-15 ENCOUNTER — Other Ambulatory Visit: Payer: Self-pay | Admitting: Family Medicine

## 2022-03-17 ENCOUNTER — Other Ambulatory Visit: Payer: Self-pay | Admitting: Family Medicine

## 2022-03-17 ENCOUNTER — Other Ambulatory Visit (HOSPITAL_BASED_OUTPATIENT_CLINIC_OR_DEPARTMENT_OTHER): Payer: Self-pay

## 2022-03-17 NOTE — Telephone Encounter (Signed)
FYI Refill too soon for both RX

## 2022-03-17 NOTE — Telephone Encounter (Signed)
Last PMV-02/03/22 No future OV scheduled.

## 2022-03-18 ENCOUNTER — Other Ambulatory Visit (HOSPITAL_COMMUNITY): Payer: Self-pay

## 2022-03-18 ENCOUNTER — Other Ambulatory Visit (HOSPITAL_BASED_OUTPATIENT_CLINIC_OR_DEPARTMENT_OTHER): Payer: Self-pay

## 2022-03-21 ENCOUNTER — Encounter: Payer: Self-pay | Admitting: Family Medicine

## 2022-03-21 NOTE — Telephone Encounter (Signed)
The Percocet will run out on 03-31-22, so he will need another PMV before then (same with the Adderall)

## 2022-03-24 ENCOUNTER — Ambulatory Visit (INDEPENDENT_AMBULATORY_CARE_PROVIDER_SITE_OTHER): Payer: Medicaid Other | Admitting: Family Medicine

## 2022-03-24 ENCOUNTER — Other Ambulatory Visit (HOSPITAL_BASED_OUTPATIENT_CLINIC_OR_DEPARTMENT_OTHER): Payer: Self-pay

## 2022-03-24 ENCOUNTER — Encounter: Payer: Self-pay | Admitting: Family Medicine

## 2022-03-24 VITALS — BP 120/78 | HR 85 | Temp 98.5°F | Wt 159.9 lb

## 2022-03-24 DIAGNOSIS — M5440 Lumbago with sciatica, unspecified side: Secondary | ICD-10-CM | POA: Diagnosis not present

## 2022-03-24 DIAGNOSIS — G8929 Other chronic pain: Secondary | ICD-10-CM | POA: Diagnosis not present

## 2022-03-24 DIAGNOSIS — F1190 Opioid use, unspecified, uncomplicated: Secondary | ICD-10-CM | POA: Diagnosis not present

## 2022-03-24 MED ORDER — OXYCODONE-ACETAMINOPHEN 10-325 MG PO TABS
1.0000 | ORAL_TABLET | Freq: Four times a day (QID) | ORAL | 0 refills | Status: DC | PRN
  Filled 2022-03-25 – 2022-03-29 (×3): qty 120, 30d supply, fill #0
  Filled ????-??-??: fill #0

## 2022-03-24 MED ORDER — ALPRAZOLAM 1 MG PO TABS
1.0000 mg | ORAL_TABLET | Freq: Three times a day (TID) | ORAL | 5 refills | Status: DC | PRN
  Filled 2022-03-24: qty 90, 30d supply, fill #0
  Filled 2022-04-25 – 2022-04-26 (×3): qty 90, 30d supply, fill #1
  Filled 2022-06-06: qty 90, 30d supply, fill #2
  Filled 2022-07-19: qty 90, 30d supply, fill #3
  Filled 2022-08-23: qty 90, 30d supply, fill #4

## 2022-03-24 MED ORDER — AMPHETAMINE-DEXTROAMPHETAMINE 20 MG PO TABS
20.0000 mg | ORAL_TABLET | Freq: Every day | ORAL | 0 refills | Status: DC
  Filled 2022-04-25: qty 30, 30d supply, fill #0
  Filled ????-??-??: fill #0

## 2022-03-24 NOTE — Progress Notes (Signed)
   Subjective:    Patient ID: James Bradley, male    DOB: 03-08-1970, 52 y.o.   MRN: [id number]  HPI Here for pain management. He doing fairly well with his back pain.    Review of Systems  Constitutional: Negative.   Musculoskeletal:  Positive for back pain.       Objective:   Physical Exam Constitutional:      Appearance: Normal appearance.  Neurological:     Mental Status: He is alert.           Assessment & Plan:  Pain management. Indication for chronic opioid: low back pain Medication and dose: Percocet 10-325 # pills per month: 120 Last UDS date: 02-03-22 Opioid Treatment Agreement signed (Y/N): 04-19-17 Opioid Treatment Agreement last reviewed with patient:  03-24-22 NCCSRS reviewed this encounter (include red flags): Yes Meds were refilled.  Alysia Penna, MD

## 2022-03-25 ENCOUNTER — Other Ambulatory Visit (HOSPITAL_COMMUNITY): Payer: Self-pay

## 2022-03-25 ENCOUNTER — Other Ambulatory Visit (HOSPITAL_BASED_OUTPATIENT_CLINIC_OR_DEPARTMENT_OTHER): Payer: Self-pay

## 2022-03-25 ENCOUNTER — Other Ambulatory Visit: Payer: Self-pay

## 2022-03-26 ENCOUNTER — Other Ambulatory Visit (HOSPITAL_BASED_OUTPATIENT_CLINIC_OR_DEPARTMENT_OTHER): Payer: Self-pay

## 2022-03-29 ENCOUNTER — Other Ambulatory Visit (HOSPITAL_BASED_OUTPATIENT_CLINIC_OR_DEPARTMENT_OTHER): Payer: Self-pay

## 2022-04-14 DIAGNOSIS — Z419 Encounter for procedure for purposes other than remedying health state, unspecified: Secondary | ICD-10-CM | POA: Diagnosis not present

## 2022-04-24 ENCOUNTER — Encounter: Payer: Self-pay | Admitting: Family Medicine

## 2022-04-25 ENCOUNTER — Other Ambulatory Visit (HOSPITAL_BASED_OUTPATIENT_CLINIC_OR_DEPARTMENT_OTHER): Payer: Self-pay

## 2022-04-26 ENCOUNTER — Other Ambulatory Visit (HOSPITAL_BASED_OUTPATIENT_CLINIC_OR_DEPARTMENT_OTHER): Payer: Self-pay

## 2022-04-28 ENCOUNTER — Other Ambulatory Visit: Payer: Self-pay | Admitting: Family Medicine

## 2022-04-28 ENCOUNTER — Other Ambulatory Visit: Payer: Self-pay

## 2022-04-28 ENCOUNTER — Other Ambulatory Visit (HOSPITAL_BASED_OUTPATIENT_CLINIC_OR_DEPARTMENT_OTHER): Payer: Self-pay

## 2022-04-28 MED ORDER — OXYCODONE-ACETAMINOPHEN 10-325 MG PO TABS
1.0000 | ORAL_TABLET | Freq: Four times a day (QID) | ORAL | 0 refills | Status: DC | PRN
  Filled 2022-04-28: qty 120, 30d supply, fill #0

## 2022-04-28 NOTE — Telephone Encounter (Signed)
Calling to check progress of this request. Says he is leaving town today

## 2022-04-28 NOTE — Telephone Encounter (Signed)
I sent in a refill dated today  

## 2022-05-14 DIAGNOSIS — Z419 Encounter for procedure for purposes other than remedying health state, unspecified: Secondary | ICD-10-CM | POA: Diagnosis not present

## 2022-05-21 ENCOUNTER — Other Ambulatory Visit (HOSPITAL_BASED_OUTPATIENT_CLINIC_OR_DEPARTMENT_OTHER): Payer: Self-pay

## 2022-05-21 ENCOUNTER — Other Ambulatory Visit: Payer: Self-pay | Admitting: Family Medicine

## 2022-05-21 MED ORDER — OXYCODONE-ACETAMINOPHEN 10-325 MG PO TABS
1.0000 | ORAL_TABLET | Freq: Four times a day (QID) | ORAL | 0 refills | Status: DC | PRN
  Filled 2022-05-28: qty 120, 30d supply, fill #0

## 2022-05-21 MED ORDER — AMPHETAMINE-DEXTROAMPHETAMINE 20 MG PO TABS
20.0000 mg | ORAL_TABLET | Freq: Every day | ORAL | 0 refills | Status: DC
  Filled 2022-05-28: qty 30, 30d supply, fill #0

## 2022-05-26 ENCOUNTER — Other Ambulatory Visit (HOSPITAL_BASED_OUTPATIENT_CLINIC_OR_DEPARTMENT_OTHER): Payer: Self-pay

## 2022-05-28 ENCOUNTER — Other Ambulatory Visit: Payer: Self-pay

## 2022-05-28 ENCOUNTER — Other Ambulatory Visit (HOSPITAL_BASED_OUTPATIENT_CLINIC_OR_DEPARTMENT_OTHER): Payer: Self-pay

## 2022-06-06 ENCOUNTER — Other Ambulatory Visit (HOSPITAL_BASED_OUTPATIENT_CLINIC_OR_DEPARTMENT_OTHER): Payer: Self-pay

## 2022-06-06 ENCOUNTER — Other Ambulatory Visit: Payer: Self-pay

## 2022-06-14 DIAGNOSIS — Z419 Encounter for procedure for purposes other than remedying health state, unspecified: Secondary | ICD-10-CM | POA: Diagnosis not present

## 2022-06-23 ENCOUNTER — Other Ambulatory Visit (HOSPITAL_BASED_OUTPATIENT_CLINIC_OR_DEPARTMENT_OTHER): Payer: Self-pay

## 2022-06-23 ENCOUNTER — Encounter: Payer: Self-pay | Admitting: Family Medicine

## 2022-06-23 ENCOUNTER — Telehealth (INDEPENDENT_AMBULATORY_CARE_PROVIDER_SITE_OTHER): Payer: 59 | Admitting: Family Medicine

## 2022-06-23 DIAGNOSIS — Z0000 Encounter for general adult medical examination without abnormal findings: Secondary | ICD-10-CM

## 2022-06-23 DIAGNOSIS — F1190 Opioid use, unspecified, uncomplicated: Secondary | ICD-10-CM

## 2022-06-23 DIAGNOSIS — M5440 Lumbago with sciatica, unspecified side: Secondary | ICD-10-CM

## 2022-06-23 DIAGNOSIS — G8929 Other chronic pain: Secondary | ICD-10-CM | POA: Diagnosis not present

## 2022-06-23 DIAGNOSIS — M25552 Pain in left hip: Secondary | ICD-10-CM | POA: Diagnosis not present

## 2022-06-23 MED ORDER — AMPHETAMINE-DEXTROAMPHETAMINE 20 MG PO TABS
20.0000 mg | ORAL_TABLET | Freq: Every day | ORAL | 0 refills | Status: DC
  Filled 2022-06-28: qty 30, 30d supply, fill #0

## 2022-06-23 MED ORDER — PREDNISONE 10 MG PO TABS
10.0000 mg | ORAL_TABLET | Freq: Two times a day (BID) | ORAL | 2 refills | Status: DC | PRN
  Filled 2022-06-23: qty 60, 30d supply, fill #0
  Filled 2022-07-19: qty 60, 30d supply, fill #1
  Filled 2022-08-11: qty 60, 30d supply, fill #2

## 2022-06-23 MED ORDER — OXYCODONE-ACETAMINOPHEN 10-325 MG PO TABS
1.0000 | ORAL_TABLET | Freq: Four times a day (QID) | ORAL | 0 refills | Status: DC | PRN
  Filled 2022-06-28: qty 120, 30d supply, fill #0

## 2022-06-23 MED ORDER — OXYCODONE-ACETAMINOPHEN 10-325 MG PO TABS
1.0000 | ORAL_TABLET | Freq: Four times a day (QID) | ORAL | 0 refills | Status: DC | PRN
  Filled 2022-08-28: qty 120, 30d supply, fill #0
  Filled ????-??-??: fill #0

## 2022-06-23 MED ORDER — OXYCODONE-ACETAMINOPHEN 10-325 MG PO TABS
1.0000 | ORAL_TABLET | Freq: Four times a day (QID) | ORAL | 0 refills | Status: DC | PRN
  Filled 2022-07-28 (×2): qty 120, 30d supply, fill #0
  Filled ????-??-??: fill #0

## 2022-06-23 MED ORDER — AMPHETAMINE-DEXTROAMPHETAMINE 20 MG PO TABS
20.0000 mg | ORAL_TABLET | Freq: Every day | ORAL | 0 refills | Status: DC
  Filled 2022-07-28 (×2): qty 30, 30d supply, fill #0
  Filled ????-??-??: fill #0

## 2022-06-23 MED ORDER — AMPHETAMINE-DEXTROAMPHETAMINE 20 MG PO TABS
20.0000 mg | ORAL_TABLET | Freq: Every day | ORAL | 0 refills | Status: DC
  Filled 2022-08-28: qty 30, 30d supply, fill #0
  Filled ????-??-??: fill #0

## 2022-06-23 NOTE — Progress Notes (Signed)
Subjective:    Patient ID: James Bradley, male    DOB: [birth year], 52 y.o.   MRN: [id number]  HPI Virtual Visit via Video Note  I connected with the patient on 06/23/22 at  8:30 AM EDT by a video enabled telemedicine application and verified that I am speaking with the correct person using two identifiers.  Location patient: home Location provider:work or home office Persons participating in the virtual visit: patient, provider  I discussed the limitations of evaluation and management by telemedicine and the availability of in person appointments. The patient expressed understanding and agreed to proceed.   HPI: Here for pain management. His back pain is about the same, but he has had more pain in the left hip lately. 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    ROS: See pertinent positives and negatives per HPI.  Past Medical History:  Diagnosis Date   Anxiety    Cancer [facility]    prostate , sees Dr. Annabell Howells    GERD (gastroesophageal reflux disease)    Herpes labialis    Insomnia    Low back pain    Onychomycosis    Prostatitis     Past Surgical History:  Procedure Laterality Date   COLONOSCOPY  August 2013    per Dr. Jeani Hawking , clear, repeat in 10 yrs    ESI Dr Ophelia Charter     microdiskectomy  2009   at L5-S1, per Dr. Zenon Mayo    PROSTATECTOMY  08-20-10   per Dr. Annabell Howells    Family History  Problem Relation Age of Onset   Arthritis Other    Diabetes Other      Current Outpatient Medications:    ALPRAZolam (XANAX) 1 MG tablet, Take 1 tablet (1 mg total) by mouth 3 (three) times daily as needed for anxiety. for anxiety, Disp: 90 tablet, Rfl: 5   amphetamine-dextroamphetamine (ADDERALL) 20 MG tablet, Take 1 tablet (20 mg total) by mouth daily., Disp: 30 tablet, Rfl: 0   levothyroxine (SYNTHROID) 75 MCG tablet, TAKE 1 TABLET BY MOUTH EVERY DAY, Disp: 90 tablet, Rfl: 3   omeprazole (PRILOSEC) 40 MG capsule, Take 1 capsule (40 mg total) by mouth daily., Disp: 90 capsule, Rfl: 3    oxyCODONE-acetaminophen (PERCOCET) 10-325 MG tablet, Take 1 tablet by mouth every 6 (six) hours as needed for pain., Disp: 120 tablet, Rfl: 0   sildenafil (VIAGRA) 100 MG tablet, Take 1 tablet (100 mg total) by mouth as needed for erectile dysfunction., Disp: 10 tablet, Rfl: 11   valACYclovir (VALTREX) 500 MG tablet, Take 1 tablet (500 mg total) by mouth 2 (two) times daily as needed (cold sores)., Disp: 60 tablet, Rfl: 11   zolpidem (AMBIEN) 10 MG tablet, Take 1 tablet (10 mg total) by mouth at bedtime as needed for sleep., Disp: 30 tablet, Rfl: 5  EXAM:  VITALS per patient if applicable:  GENERAL: alert, oriented, appears well and in no acute distress  HEENT: atraumatic, conjunttiva clear, no obvious abnormalities on inspection of external nose and ears  NECK: normal movements of the head and neck  LUNGS: on inspection no signs of respiratory distress, breathing rate appears normal, no obvious gross SOB, gasping or wheezing  CV: no obvious cyanosis  MS: moves all visible extremities without noticeable abnormality  PSYCH/NEURO: pleasant and cooperative, no obvious depression or anxiety, speech and thought processing grossly intact  ASSESSMENT AND PLAN: Pain management. Indication for chronic opioid: low back pain Medication and dose: Percocet 10-325 # pills per  month: 120 Last UDS date: 02-03-22 Opioid Treatment Agreement signed (Y/N): 04-19-17 Opioid Treatment Agreement last reviewed with patient:  06-23-22 NCCSRS reviewed this encounter (include red flags): Yes Meds were refilled. He can also add Prednisone 10 mg BID as needed. We will refer him to Orthopedics to evaluate the left hip. Gershon Crane, MD  Discussed the following assessment and plan:  No diagnosis found.     I discussed the assessment and treatment plan with the patient. The patient was provided an opportunity to ask questions and all were answered. The patient agreed with the plan and demonstrated an  understanding of the instructions.   The patient was advised to call back or seek an in-person evaluation if the symptoms worsen or if the condition fails to improve as anticipated.      Review of Systems     Objective:   Physical Exam        Assessment & Plan:

## 2022-06-27 ENCOUNTER — Other Ambulatory Visit (HOSPITAL_BASED_OUTPATIENT_CLINIC_OR_DEPARTMENT_OTHER): Payer: Self-pay

## 2022-06-28 ENCOUNTER — Other Ambulatory Visit (HOSPITAL_BASED_OUTPATIENT_CLINIC_OR_DEPARTMENT_OTHER): Payer: Self-pay

## 2022-07-14 DIAGNOSIS — Z419 Encounter for procedure for purposes other than remedying health state, unspecified: Secondary | ICD-10-CM | POA: Diagnosis not present

## 2022-07-19 ENCOUNTER — Other Ambulatory Visit (HOSPITAL_BASED_OUTPATIENT_CLINIC_OR_DEPARTMENT_OTHER): Payer: Self-pay

## 2022-07-27 ENCOUNTER — Encounter (HOSPITAL_BASED_OUTPATIENT_CLINIC_OR_DEPARTMENT_OTHER): Payer: Self-pay

## 2022-07-27 ENCOUNTER — Other Ambulatory Visit (HOSPITAL_BASED_OUTPATIENT_CLINIC_OR_DEPARTMENT_OTHER): Payer: Self-pay

## 2022-07-28 ENCOUNTER — Other Ambulatory Visit (HOSPITAL_BASED_OUTPATIENT_CLINIC_OR_DEPARTMENT_OTHER): Payer: Self-pay

## 2022-07-28 ENCOUNTER — Other Ambulatory Visit: Payer: Self-pay

## 2022-08-11 ENCOUNTER — Other Ambulatory Visit: Payer: Self-pay | Admitting: Family Medicine

## 2022-08-11 ENCOUNTER — Other Ambulatory Visit: Payer: Self-pay

## 2022-08-13 ENCOUNTER — Other Ambulatory Visit (HOSPITAL_BASED_OUTPATIENT_CLINIC_OR_DEPARTMENT_OTHER): Payer: Self-pay

## 2022-08-13 MED ORDER — ZOLPIDEM TARTRATE 10 MG PO TABS
10.0000 mg | ORAL_TABLET | Freq: Every evening | ORAL | 5 refills | Status: DC | PRN
  Filled 2022-08-13: qty 15, 15d supply, fill #0
  Filled 2022-08-14: qty 30, 30d supply, fill #0
  Filled 2022-10-25 (×2): qty 30, 30d supply, fill #1
  Filled 2022-12-24: qty 30, 30d supply, fill #2
  Filled 2023-01-21 – 2023-01-22 (×2): qty 30, 30d supply, fill #3

## 2022-08-14 ENCOUNTER — Other Ambulatory Visit (HOSPITAL_BASED_OUTPATIENT_CLINIC_OR_DEPARTMENT_OTHER): Payer: Self-pay

## 2022-08-14 DIAGNOSIS — Z419 Encounter for procedure for purposes other than remedying health state, unspecified: Secondary | ICD-10-CM | POA: Diagnosis not present

## 2022-08-23 ENCOUNTER — Other Ambulatory Visit (HOSPITAL_BASED_OUTPATIENT_CLINIC_OR_DEPARTMENT_OTHER): Payer: Self-pay

## 2022-08-27 ENCOUNTER — Other Ambulatory Visit (HOSPITAL_BASED_OUTPATIENT_CLINIC_OR_DEPARTMENT_OTHER): Payer: Self-pay

## 2022-08-28 ENCOUNTER — Other Ambulatory Visit (HOSPITAL_BASED_OUTPATIENT_CLINIC_OR_DEPARTMENT_OTHER): Payer: Self-pay

## 2022-08-28 ENCOUNTER — Other Ambulatory Visit: Payer: Self-pay

## 2022-09-10 ENCOUNTER — Other Ambulatory Visit: Payer: Self-pay | Admitting: Family Medicine

## 2022-09-12 ENCOUNTER — Other Ambulatory Visit (HOSPITAL_BASED_OUTPATIENT_CLINIC_OR_DEPARTMENT_OTHER): Payer: Self-pay

## 2022-09-12 MED ORDER — PREDNISONE 10 MG PO TABS
10.0000 mg | ORAL_TABLET | Freq: Two times a day (BID) | ORAL | 5 refills | Status: DC | PRN
  Filled 2022-09-12 – 2022-09-25 (×2): qty 60, 30d supply, fill #0
  Filled 2022-10-28: qty 60, 30d supply, fill #1
  Filled 2023-01-09 (×2): qty 60, 30d supply, fill #2
  Filled 2023-02-20: qty 60, 30d supply, fill #3
  Filled 2023-03-18: qty 60, 30d supply, fill #4
  Filled 2023-05-12 (×2): qty 60, 30d supply, fill #5

## 2022-09-12 NOTE — Telephone Encounter (Signed)
Pt LOV was on 03/24/22 Last refill was done on 06/23/22 Please advise

## 2022-09-14 DIAGNOSIS — Z419 Encounter for procedure for purposes other than remedying health state, unspecified: Secondary | ICD-10-CM | POA: Diagnosis not present

## 2022-09-23 ENCOUNTER — Other Ambulatory Visit (HOSPITAL_BASED_OUTPATIENT_CLINIC_OR_DEPARTMENT_OTHER): Payer: Self-pay

## 2022-09-25 ENCOUNTER — Other Ambulatory Visit: Payer: Self-pay

## 2022-09-25 ENCOUNTER — Other Ambulatory Visit (HOSPITAL_BASED_OUTPATIENT_CLINIC_OR_DEPARTMENT_OTHER): Payer: Self-pay

## 2022-09-25 ENCOUNTER — Encounter: Payer: Self-pay | Admitting: Family Medicine

## 2022-09-25 ENCOUNTER — Telehealth (INDEPENDENT_AMBULATORY_CARE_PROVIDER_SITE_OTHER): Payer: 59 | Admitting: Family Medicine

## 2022-09-25 DIAGNOSIS — F1190 Opioid use, unspecified, uncomplicated: Secondary | ICD-10-CM | POA: Diagnosis not present

## 2022-09-25 DIAGNOSIS — M5440 Lumbago with sciatica, unspecified side: Secondary | ICD-10-CM

## 2022-09-25 DIAGNOSIS — G8929 Other chronic pain: Secondary | ICD-10-CM

## 2022-09-25 MED ORDER — OXYCODONE-ACETAMINOPHEN 10-325 MG PO TABS
1.0000 | ORAL_TABLET | Freq: Four times a day (QID) | ORAL | 0 refills | Status: DC | PRN
  Filled 2022-09-25 – 2022-10-25 (×3): qty 120, 30d supply, fill #0

## 2022-09-25 MED ORDER — AMPHETAMINE-DEXTROAMPHETAMINE 20 MG PO TABS
20.0000 mg | ORAL_TABLET | Freq: Every day | ORAL | 0 refills | Status: DC
  Filled 2022-09-25: qty 30, 30d supply, fill #0

## 2022-09-25 MED ORDER — ALPRAZOLAM 1 MG PO TABS
1.0000 mg | ORAL_TABLET | Freq: Three times a day (TID) | ORAL | 5 refills | Status: DC | PRN
  Filled 2022-09-25: qty 90, 30d supply, fill #0
  Filled 2022-10-25 (×2): qty 90, 30d supply, fill #1
  Filled 2022-11-21 – 2022-11-22 (×4): qty 90, 30d supply, fill #2
  Filled 2022-12-24: qty 90, 30d supply, fill #3
  Filled 2023-01-21: qty 90, 30d supply, fill #4
  Filled 2023-02-20: qty 90, 30d supply, fill #5

## 2022-09-25 MED ORDER — AMPHETAMINE-DEXTROAMPHETAMINE 20 MG PO TABS
20.0000 mg | ORAL_TABLET | Freq: Every day | ORAL | 0 refills | Status: DC
  Filled 2022-09-25 – 2022-12-08 (×4): qty 30, 30d supply, fill #0
  Filled ????-??-??: fill #0

## 2022-09-25 MED ORDER — OXYCODONE-ACETAMINOPHEN 10-325 MG PO TABS
1.0000 | ORAL_TABLET | Freq: Four times a day (QID) | ORAL | 0 refills | Status: DC | PRN
  Filled 2022-09-25: qty 120, 30d supply, fill #0

## 2022-09-25 MED ORDER — AMPHETAMINE-DEXTROAMPHETAMINE 20 MG PO TABS
20.0000 mg | ORAL_TABLET | Freq: Every day | ORAL | 0 refills | Status: DC
  Filled 2022-09-25 – 2022-10-25 (×2): qty 30, 30d supply, fill #0

## 2022-09-25 MED ORDER — OXYCODONE-ACETAMINOPHEN 10-325 MG PO TABS
1.0000 | ORAL_TABLET | Freq: Four times a day (QID) | ORAL | 0 refills | Status: DC | PRN
  Filled 2022-09-25 – 2022-11-21 (×2): qty 120, 30d supply, fill #0

## 2022-09-25 NOTE — Progress Notes (Signed)
Subjective:    Patient ID: James Bradley, male    DOB: [birth year], 52 y.o.   MRN: [id number]  HPI Virtual Visit via Video Note  I connected with the patient on 09/25/22 at  8:30 AM EDT by a video enabled telemedicine application and verified that I am speaking with the correct person using two identifiers.  Location patient: home Location provider:work or home office Persons participating in the virtual visit: patient, provider  I discussed the limitations of evaluation and management by telemedicine and the availability of in person appointments. The patient expressed understanding and agreed to proceed.   HPI: Here for pain management. He is doing well.    ROS: See pertinent positives and negatives per HPI.  Past Medical History:  Diagnosis Date   Anxiety    Cancer [facility])    prostate , sees Dr. Annabell Howells    GERD (gastroesophageal reflux disease)    Herpes labialis    Insomnia    Low back pain    Onychomycosis    Prostatitis     Past Surgical History:  Procedure Laterality Date   COLONOSCOPY  August 2013    per Dr. Jeani Hawking , clear, repeat in 10 yrs    ESI Dr Ophelia Charter     microdiskectomy  2009   at L5-S1, per Dr. Zenon Mayo    PROSTATECTOMY  08-20-10   per Dr. Annabell Howells    Family History  Problem Relation Age of Onset   Arthritis Other    Diabetes Other      Current Outpatient Medications:    ALPRAZolam (XANAX) 1 MG tablet, Take 1 tablet (1 mg total) by mouth 3 (three) times daily as needed for anxiety. for anxiety, Disp: 90 tablet, Rfl: 5   amphetamine-dextroamphetamine (ADDERALL) 20 MG tablet, Take 1 tablet (20 mg total) by mouth daily., Disp: 30 tablet, Rfl: 0   amphetamine-dextroamphetamine (ADDERALL) 20 MG tablet, Take 1 tablet (20 mg total) by mouth daily., Disp: 30 tablet, Rfl: 0   amphetamine-dextroamphetamine (ADDERALL) 20 MG tablet, Take 1 tablet (20 mg total) by mouth daily., Disp: 30 tablet, Rfl: 0   levothyroxine (SYNTHROID) 75 MCG tablet, TAKE 1  TABLET BY MOUTH EVERY DAY, Disp: 90 tablet, Rfl: 3   omeprazole (PRILOSEC) 40 MG capsule, Take 1 capsule (40 mg total) by mouth daily., Disp: 90 capsule, Rfl: 3   oxyCODONE-acetaminophen (PERCOCET) 10-325 MG tablet, Take 1 tablet by mouth every 6 (six) hours as needed for pain., Disp: 120 tablet, Rfl: 0   oxyCODONE-acetaminophen (PERCOCET) 10-325 MG tablet, Take 1 tablet by mouth every 6 (six) hours as needed for pain., Disp: 120 tablet, Rfl: 0   oxyCODONE-acetaminophen (PERCOCET) 10-325 MG tablet, Take 1 tablet by mouth every 6 (six) hours as needed for pain., Disp: 120 tablet, Rfl: 0   predniSONE (DELTASONE) 10 MG tablet, Take 1 tablet (10 mg total) by mouth 2 (two) times daily as needed (pain)., Disp: 60 tablet, Rfl: 5   sildenafil (VIAGRA) 100 MG tablet, Take 1 tablet (100 mg total) by mouth as needed for erectile dysfunction., Disp: 10 tablet, Rfl: 11   valACYclovir (VALTREX) 500 MG tablet, Take 1 tablet (500 mg total) by mouth 2 (two) times daily as needed (cold sores)., Disp: 60 tablet, Rfl: 11   zolpidem (AMBIEN) 10 MG tablet, Take 1 tablet (10 mg total) by mouth at bedtime as needed for sleep., Disp: 30 tablet, Rfl: 5  EXAM:  VITALS per patient if applicable:  GENERAL: alert, oriented, appears well and  in no acute distress  HEENT: atraumatic, conjunttiva clear, no obvious abnormalities on inspection of external nose and ears  NECK: normal movements of the head and neck  LUNGS: on inspection no signs of respiratory distress, breathing rate appears normal, no obvious gross SOB, gasping or wheezing  CV: no obvious cyanosis  MS: moves all visible extremities without noticeable abnormality  PSYCH/NEURO: pleasant and cooperative, no obvious depression or anxiety, speech and thought processing grossly intact  ASSESSMENT AND PLAN: Pain management. Indication for chronic opioid: low back pain Medication and dose: Percocet 10-325 # pills per month: 120 Last UDS date: 02-03-22 Opioid  Treatment Agreement signed (Y/N): 04-19-17 Opioid Treatment Agreement last reviewed with patient:  09-25-22 NCCSRS reviewed this encounter (include red flags): Yes Meds were refilled.  Gershon Crane, MD  Discussed the following assessment and plan:  No diagnosis found.     I discussed the assessment and treatment plan with the patient. The patient was provided an opportunity to ask questions and all were answered. The patient agreed with the plan and demonstrated an understanding of the instructions.   The patient was advised to call back or seek an in-person evaluation if the symptoms worsen or if the condition fails to improve as anticipated.      Review of Systems     Objective:   Physical Exam        Assessment & Plan:

## 2022-10-01 ENCOUNTER — Ambulatory Visit (INDEPENDENT_AMBULATORY_CARE_PROVIDER_SITE_OTHER): Payer: 59 | Admitting: Family Medicine

## 2022-10-01 ENCOUNTER — Encounter: Payer: Self-pay | Admitting: Family Medicine

## 2022-10-01 VITALS — BP 128/80 | HR 89 | Temp 98.3°F | Wt 151.4 lb

## 2022-10-01 DIAGNOSIS — G8929 Other chronic pain: Secondary | ICD-10-CM

## 2022-10-01 DIAGNOSIS — F411 Generalized anxiety disorder: Secondary | ICD-10-CM | POA: Diagnosis not present

## 2022-10-01 DIAGNOSIS — F900 Attention-deficit hyperactivity disorder, predominantly inattentive type: Secondary | ICD-10-CM | POA: Diagnosis not present

## 2022-10-01 DIAGNOSIS — M5440 Lumbago with sciatica, unspecified side: Secondary | ICD-10-CM | POA: Diagnosis not present

## 2022-10-01 NOTE — Progress Notes (Signed)
Subjective:    Patient ID: James Bradley, male    DOB: [birth year], 52 y.o.   MRN: [id number]  HPI Here for a health evaluation prior to him starting a new job at the [employer]. He had a physical exam, and now he has forms for [place] to fill out to clear him for all work duties.    Review of Systems  Constitutional: Negative.   Respiratory: Negative.    Cardiovascular: Negative.   Neurological: Negative.        Objective:   Physical Exam Constitutional:      Appearance: Normal appearance.  Cardiovascular:     Rate and Rhythm: Normal rate and regular rhythm.     Pulses: Normal pulses.     Heart sounds: Normal heart sounds.  Pulmonary:     Effort: Pulmonary effort is normal.     Breath sounds: Normal breath sounds.  Neurological:     Mental Status: He is alert.           Assessment & Plan:  Pre-employment evaluation. He is cleared for all job duties, and we filled out the forms he brought to [place].  Gershon Crane, MD

## 2022-10-09 ENCOUNTER — Other Ambulatory Visit (HOSPITAL_BASED_OUTPATIENT_CLINIC_OR_DEPARTMENT_OTHER): Payer: Self-pay

## 2022-10-14 DIAGNOSIS — Z419 Encounter for procedure for purposes other than remedying health state, unspecified: Secondary | ICD-10-CM | POA: Diagnosis not present

## 2022-10-17 ENCOUNTER — Other Ambulatory Visit: Payer: Self-pay | Admitting: Family Medicine

## 2022-10-17 DIAGNOSIS — Z1211 Encounter for screening for malignant neoplasm of colon: Secondary | ICD-10-CM

## 2022-10-17 DIAGNOSIS — Z1212 Encounter for screening for malignant neoplasm of rectum: Secondary | ICD-10-CM

## 2022-10-24 ENCOUNTER — Other Ambulatory Visit (HOSPITAL_BASED_OUTPATIENT_CLINIC_OR_DEPARTMENT_OTHER): Payer: Self-pay

## 2022-10-25 ENCOUNTER — Other Ambulatory Visit (HOSPITAL_BASED_OUTPATIENT_CLINIC_OR_DEPARTMENT_OTHER): Payer: Self-pay

## 2022-10-27 ENCOUNTER — Encounter (HOSPITAL_BASED_OUTPATIENT_CLINIC_OR_DEPARTMENT_OTHER): Payer: Self-pay

## 2022-10-28 ENCOUNTER — Other Ambulatory Visit (HOSPITAL_BASED_OUTPATIENT_CLINIC_OR_DEPARTMENT_OTHER): Payer: Self-pay

## 2022-11-14 DIAGNOSIS — Z419 Encounter for procedure for purposes other than remedying health state, unspecified: Secondary | ICD-10-CM | POA: Diagnosis not present

## 2022-11-19 ENCOUNTER — Encounter: Payer: Self-pay | Admitting: Family Medicine

## 2022-11-19 ENCOUNTER — Other Ambulatory Visit (HOSPITAL_BASED_OUTPATIENT_CLINIC_OR_DEPARTMENT_OTHER): Payer: Self-pay

## 2022-11-20 NOTE — Telephone Encounter (Signed)
Pt need ok to have med refill on 11-21-2022 pt is going out of town on saturday

## 2022-11-21 ENCOUNTER — Other Ambulatory Visit (HOSPITAL_BASED_OUTPATIENT_CLINIC_OR_DEPARTMENT_OTHER): Payer: Self-pay

## 2022-11-21 ENCOUNTER — Other Ambulatory Visit: Payer: Self-pay

## 2022-11-21 NOTE — Telephone Encounter (Signed)
Please call his pharmacy to okay the early refill

## 2022-11-22 ENCOUNTER — Other Ambulatory Visit (HOSPITAL_BASED_OUTPATIENT_CLINIC_OR_DEPARTMENT_OTHER): Payer: Self-pay

## 2022-11-24 NOTE — Telephone Encounter (Signed)
Spoke with pt pharmacy stated that pt insurance denied coverage for early refill. Pt notified

## 2022-11-24 NOTE — Telephone Encounter (Signed)
Spoke with pt pharmacy stated that pt insurance denied early refill, pt notified of an option of paying out of pocket.

## 2022-11-25 ENCOUNTER — Other Ambulatory Visit (HOSPITAL_BASED_OUTPATIENT_CLINIC_OR_DEPARTMENT_OTHER): Payer: Self-pay

## 2022-12-05 ENCOUNTER — Other Ambulatory Visit (HOSPITAL_BASED_OUTPATIENT_CLINIC_OR_DEPARTMENT_OTHER): Payer: Self-pay

## 2022-12-08 ENCOUNTER — Other Ambulatory Visit (HOSPITAL_BASED_OUTPATIENT_CLINIC_OR_DEPARTMENT_OTHER): Payer: Self-pay

## 2022-12-14 DIAGNOSIS — Z419 Encounter for procedure for purposes other than remedying health state, unspecified: Secondary | ICD-10-CM | POA: Diagnosis not present

## 2022-12-16 ENCOUNTER — Encounter: Payer: Self-pay | Admitting: Family Medicine

## 2022-12-16 ENCOUNTER — Other Ambulatory Visit (HOSPITAL_BASED_OUTPATIENT_CLINIC_OR_DEPARTMENT_OTHER): Payer: Self-pay

## 2022-12-16 ENCOUNTER — Telehealth (INDEPENDENT_AMBULATORY_CARE_PROVIDER_SITE_OTHER): Payer: 59 | Admitting: Family Medicine

## 2022-12-16 DIAGNOSIS — F1190 Opioid use, unspecified, uncomplicated: Secondary | ICD-10-CM

## 2022-12-16 DIAGNOSIS — M5440 Lumbago with sciatica, unspecified side: Secondary | ICD-10-CM

## 2022-12-16 DIAGNOSIS — G8929 Other chronic pain: Secondary | ICD-10-CM | POA: Diagnosis not present

## 2022-12-16 MED ORDER — OXYCODONE-ACETAMINOPHEN 10-325 MG PO TABS
1.0000 | ORAL_TABLET | Freq: Four times a day (QID) | ORAL | 0 refills | Status: DC | PRN
  Filled 2023-02-18: qty 120, 30d supply, fill #0
  Filled ????-??-??: fill #0

## 2022-12-16 MED ORDER — OXYCODONE-ACETAMINOPHEN 10-325 MG PO TABS
1.0000 | ORAL_TABLET | Freq: Four times a day (QID) | ORAL | 0 refills | Status: DC | PRN
  Filled 2022-12-20: qty 120, 30d supply, fill #0

## 2022-12-16 MED ORDER — OXYCODONE-ACETAMINOPHEN 10-325 MG PO TABS
1.0000 | ORAL_TABLET | Freq: Four times a day (QID) | ORAL | 0 refills | Status: DC | PRN
  Filled 2023-01-20: qty 120, 30d supply, fill #0
  Filled ????-??-??: fill #0

## 2022-12-16 NOTE — Progress Notes (Signed)
Subjective:    Patient ID: James Bradley, male    DOB: 08-Jun-1970, 52 y.o.   MRN: [id number]  HPI Virtual Visit via Video Note  I connected with the patient on 12/16/22 at  2:00 PM EST by a video enabled telemedicine application and verified that I am speaking with the correct person using two identifiers.  Location patient: home Location provider:work or home office Persons participating in the virtual visit: patient, provider  I discussed the limitations of evaluation and management by telemedicine and the availability of in person appointments. The patient expressed understanding and agreed to proceed.   HPI: Here for pain management. He is doing well.    ROS: See pertinent positives and negatives per HPI.  Past Medical History:  Diagnosis Date   Anxiety    Cancer [facility])    prostate , sees Dr. Annabell Howells    GERD (gastroesophageal reflux disease)    Herpes labialis    Insomnia    Low back pain    Onychomycosis    Prostatitis     Past Surgical History:  Procedure Laterality Date   COLONOSCOPY  August 2013    per Dr. Jeani Hawking , clear, repeat in 10 yrs    ESI Dr Ophelia Charter     microdiskectomy  2009   at L5-S1, per Dr. Zenon Mayo    PROSTATECTOMY  08-20-10   per Dr. Annabell Howells    Family History  Problem Relation Age of Onset   Arthritis Other    Diabetes Other      Current Outpatient Medications:    ALPRAZolam (XANAX) 1 MG tablet, Take 1 tablet (1 mg total) by mouth 3 (three) times daily as needed for anxiety. for anxiety, Disp: 90 tablet, Rfl: 5   amphetamine-dextroamphetamine (ADDERALL) 20 MG tablet, Take 1 tablet (20 mg total) by mouth daily., Disp: 30 tablet, Rfl: 0   amphetamine-dextroamphetamine (ADDERALL) 20 MG tablet, Take 1 tablet (20 mg total) by mouth daily., Disp: 30 tablet, Rfl: 0   amphetamine-dextroamphetamine (ADDERALL) 20 MG tablet, Take 1 tablet (20 mg total) by mouth daily., Disp: 30 tablet, Rfl: 0   levothyroxine (SYNTHROID) 75 MCG tablet, TAKE 1  TABLET BY MOUTH EVERY DAY, Disp: 90 tablet, Rfl: 3   omeprazole (PRILOSEC) 40 MG capsule, Take 1 capsule (40 mg total) by mouth daily., Disp: 90 capsule, Rfl: 3   predniSONE (DELTASONE) 10 MG tablet, Take 1 tablet (10 mg total) by mouth 2 (two) times daily as needed (pain)., Disp: 60 tablet, Rfl: 5   sildenafil (VIAGRA) 100 MG tablet, Take 1 tablet (100 mg total) by mouth as needed for erectile dysfunction., Disp: 10 tablet, Rfl: 11   valACYclovir (VALTREX) 500 MG tablet, Take 1 tablet (500 mg total) by mouth 2 (two) times daily as needed (cold sores)., Disp: 60 tablet, Rfl: 11   zolpidem (AMBIEN) 10 MG tablet, Take 1 tablet (10 mg total) by mouth at bedtime as needed for sleep., Disp: 30 tablet, Rfl: 5   [START ON 12/20/2022] oxyCODONE-acetaminophen (PERCOCET) 10-325 MG tablet, Take 1 tablet by mouth every 6 (six) hours as needed for pain., Disp: 120 tablet, Rfl: 0   [START ON 12/20/2022] oxyCODONE-acetaminophen (PERCOCET) 10-325 MG tablet, Take 1 tablet by mouth every 6 (six) hours as needed for pain., Disp: 120 tablet, Rfl: 0   [START ON 12/20/2022] oxyCODONE-acetaminophen (PERCOCET) 10-325 MG tablet, Take 1 tablet by mouth every 6 (six) hours as needed for pain., Disp: 120 tablet, Rfl: 0  EXAM:  VITALS per patient  if applicable:  GENERAL: alert, oriented, appears well and in no acute distress  HEENT: atraumatic, conjunttiva clear, no obvious abnormalities on inspection of external nose and ears  NECK: normal movements of the head and neck  LUNGS: on inspection no signs of respiratory distress, breathing rate appears normal, no obvious gross SOB, gasping or wheezing  CV: no obvious cyanosis  MS: moves all visible extremities without noticeable abnormality  PSYCH/NEURO: pleasant and cooperative, no obvious depression or anxiety, speech and thought processing grossly intact  ASSESSMENT AND PLAN: Pain management. Indication for chronic opioid: low back pain Medication and dose: Percocet  10-325 # pills per month: 120 Last UDS date: 02-03-22 Opioid Treatment Agreement signed (Y/N): 04-19-17 Opioid Treatment Agreement last reviewed with patient:  12-16-22 [facility] reviewed this encounter (include red flags): Yes Meds were refilled.  Gershon Crane, MD  Discussed the following assessment and plan:  No diagnosis found.     I discussed the assessment and treatment plan with the patient. The patient was provided an opportunity to ask questions and all were answered. The patient agreed with the plan and demonstrated an understanding of the instructions.   The patient was advised to call back or seek an in-person evaluation if the symptoms worsen or if the condition fails to improve as anticipated.      Review of Systems     Objective:   Physical Exam        Assessment & Plan:

## 2022-12-19 ENCOUNTER — Other Ambulatory Visit (HOSPITAL_BASED_OUTPATIENT_CLINIC_OR_DEPARTMENT_OTHER): Payer: Self-pay

## 2022-12-20 ENCOUNTER — Other Ambulatory Visit (HOSPITAL_BASED_OUTPATIENT_CLINIC_OR_DEPARTMENT_OTHER): Payer: Self-pay

## 2022-12-24 ENCOUNTER — Other Ambulatory Visit (HOSPITAL_BASED_OUTPATIENT_CLINIC_OR_DEPARTMENT_OTHER): Payer: Self-pay

## 2023-01-05 ENCOUNTER — Encounter: Payer: Self-pay | Admitting: Family Medicine

## 2023-01-05 LAB — COLOGUARD
COLOGUARD: NEGATIVE
Cologuard: NEGATIVE

## 2023-01-09 ENCOUNTER — Encounter: Payer: Self-pay | Admitting: Family Medicine

## 2023-01-09 ENCOUNTER — Telehealth (INDEPENDENT_AMBULATORY_CARE_PROVIDER_SITE_OTHER): Payer: 59 | Admitting: Family Medicine

## 2023-01-09 ENCOUNTER — Other Ambulatory Visit (HOSPITAL_COMMUNITY): Payer: Self-pay

## 2023-01-09 ENCOUNTER — Other Ambulatory Visit (HOSPITAL_BASED_OUTPATIENT_CLINIC_OR_DEPARTMENT_OTHER): Payer: Self-pay

## 2023-01-09 ENCOUNTER — Other Ambulatory Visit: Payer: Self-pay | Admitting: Family Medicine

## 2023-01-09 DIAGNOSIS — Z1211 Encounter for screening for malignant neoplasm of colon: Secondary | ICD-10-CM | POA: Diagnosis not present

## 2023-01-09 NOTE — Progress Notes (Signed)
Subjective:    Patient ID: James Bradley, male    DOB: 1970-09-11, 52 y.o.   MRN: [id number]  HPI Virtual Visit via Video Note  I connected with the patient on 01/09/23 at  9:00 AM EST by a video enabled telemedicine application and verified that I am speaking with the correct person using two identifiers.  Location patient: home Location provider:work or home office Persons participating in the virtual visit: patient, provider  I discussed the limitations of evaluation and management by telemedicine and the availability of in person appointments. The patient expressed understanding and agreed to proceed.   HPI: Here with questions about his recent Cologuard test on [id number]. This was negative. He asks about the accuracy of this test.   ROS: See pertinent positives and negatives per HPI.  Past Medical History:  Diagnosis Date   Anxiety    Cancer [facility])    prostate , sees Dr. Annabell Howells    GERD (gastroesophageal reflux disease)    Herpes labialis    Insomnia    Low back pain    Onychomycosis    Prostatitis     Past Surgical History:  Procedure Laterality Date   COLONOSCOPY  August 2013    per Dr. Jeani Hawking , clear, repeat in 10 yrs    ESI Dr Ophelia Charter     microdiskectomy  2009   at L5-S1, per Dr. Zenon Mayo    PROSTATECTOMY  08-20-10   per Dr. Annabell Howells    Family History  Problem Relation Age of Onset   Arthritis Other    Diabetes Other      Current Outpatient Medications:    ALPRAZolam (XANAX) 1 MG tablet, Take 1 tablet (1 mg total) by mouth 3 (three) times daily as needed for anxiety. for anxiety, Disp: 90 tablet, Rfl: 5   amphetamine-dextroamphetamine (ADDERALL) 20 MG tablet, Take 1 tablet (20 mg total) by mouth daily., Disp: 30 tablet, Rfl: 0   amphetamine-dextroamphetamine (ADDERALL) 20 MG tablet, Take 1 tablet (20 mg total) by mouth daily., Disp: 30 tablet, Rfl: 0   amphetamine-dextroamphetamine (ADDERALL) 20 MG tablet, Take 1 tablet (20 mg total) by mouth  daily., Disp: 30 tablet, Rfl: 0   levothyroxine (SYNTHROID) 75 MCG tablet, TAKE 1 TABLET BY MOUTH EVERY DAY, Disp: 90 tablet, Rfl: 3   omeprazole (PRILOSEC) 40 MG capsule, Take 1 capsule (40 mg total) by mouth daily., Disp: 90 capsule, Rfl: 3   oxyCODONE-acetaminophen (PERCOCET) 10-325 MG tablet, Take 1 tablet by mouth every 6 (six) hours as needed for pain., Disp: 120 tablet, Rfl: 0   oxyCODONE-acetaminophen (PERCOCET) 10-325 MG tablet, Take 1 tablet by mouth every 6 (six) hours as needed for pain., Disp: 120 tablet, Rfl: 0   oxyCODONE-acetaminophen (PERCOCET) 10-325 MG tablet, Take 1 tablet by mouth every 6 (six) hours as needed for pain., Disp: 120 tablet, Rfl: 0   predniSONE (DELTASONE) 10 MG tablet, Take 1 tablet (10 mg total) by mouth 2 (two) times daily as needed (pain)., Disp: 60 tablet, Rfl: 5   sildenafil (VIAGRA) 100 MG tablet, Take 1 tablet (100 mg total) by mouth as needed for erectile dysfunction., Disp: 10 tablet, Rfl: 11   valACYclovir (VALTREX) 500 MG tablet, Take 1 tablet (500 mg total) by mouth 2 (two) times daily as needed (cold sores)., Disp: 60 tablet, Rfl: 11   zolpidem (AMBIEN) 10 MG tablet, Take 1 tablet (10 mg total) by mouth at bedtime as needed for sleep., Disp: 30 tablet, Rfl: 5  EXAM:  VITALS per patient if applicable:  GENERAL: alert, oriented, appears well and in no acute distress  HEENT: atraumatic, conjunttiva clear, no obvious abnormalities on inspection of external nose and ears  NECK: normal movements of the head and neck  LUNGS: on inspection no signs of respiratory distress, breathing rate appears normal, no obvious gross SOB, gasping or wheezing  CV: no obvious cyanosis  MS: moves all visible extremities without noticeable abnormality  PSYCH/NEURO: pleasant and cooperative, no obvious depression or anxiety, speech and thought processing grossly intact  ASSESSMENT AND PLAN: Colon cancer screening. I told him that the Cologuard test is 92% accurate  in detecting colon cancers and is 47% accurate in detecting precancerous polyps. He understands, and he knows that insurance typically covers this test every 3 years. Gershon Crane, MD  Discussed the following assessment and plan:  No diagnosis found.     I discussed the assessment and treatment plan with the patient. The patient was provided an opportunity to ask questions and all were answered. The patient agreed with the plan and demonstrated an understanding of the instructions.   The patient was advised to call back or seek an in-person evaluation if the symptoms worsen or if the condition fails to improve as anticipated.      Review of Systems     Objective:   Physical Exam        Assessment & Plan:

## 2023-01-12 ENCOUNTER — Other Ambulatory Visit (HOSPITAL_BASED_OUTPATIENT_CLINIC_OR_DEPARTMENT_OTHER): Payer: Self-pay

## 2023-01-12 MED ORDER — AMPHETAMINE-DEXTROAMPHETAMINE 20 MG PO TABS
20.0000 mg | ORAL_TABLET | Freq: Every day | ORAL | 0 refills | Status: DC
  Filled 2023-01-12: qty 30, 30d supply, fill #0

## 2023-01-12 MED ORDER — AMPHETAMINE-DEXTROAMPHETAMINE 20 MG PO TABS
20.0000 mg | ORAL_TABLET | Freq: Every day | ORAL | 0 refills | Status: DC
  Filled 2023-01-12 – 2023-02-18 (×2): qty 30, 30d supply, fill #0

## 2023-01-12 NOTE — Telephone Encounter (Signed)
Last VV was 01/09/23 Please advise

## 2023-01-12 NOTE — Telephone Encounter (Signed)
Done

## 2023-01-13 ENCOUNTER — Encounter: Payer: Self-pay | Admitting: Family Medicine

## 2023-01-13 ENCOUNTER — Ambulatory Visit (INDEPENDENT_AMBULATORY_CARE_PROVIDER_SITE_OTHER): Payer: 59 | Admitting: Family Medicine

## 2023-01-13 ENCOUNTER — Other Ambulatory Visit (HOSPITAL_BASED_OUTPATIENT_CLINIC_OR_DEPARTMENT_OTHER): Payer: Self-pay

## 2023-01-13 VITALS — BP 120/80 | HR 94 | Temp 98.4°F | Ht 65.0 in | Wt 144.8 lb

## 2023-01-13 DIAGNOSIS — E039 Hypothyroidism, unspecified: Secondary | ICD-10-CM | POA: Diagnosis not present

## 2023-01-13 DIAGNOSIS — Z13220 Encounter for screening for lipoid disorders: Secondary | ICD-10-CM | POA: Diagnosis not present

## 2023-01-13 DIAGNOSIS — Z0000 Encounter for general adult medical examination without abnormal findings: Secondary | ICD-10-CM

## 2023-01-13 DIAGNOSIS — F1190 Opioid use, unspecified, uncomplicated: Secondary | ICD-10-CM

## 2023-01-13 DIAGNOSIS — Z125 Encounter for screening for malignant neoplasm of prostate: Secondary | ICD-10-CM | POA: Diagnosis not present

## 2023-01-13 DIAGNOSIS — Z131 Encounter for screening for diabetes mellitus: Secondary | ICD-10-CM | POA: Diagnosis not present

## 2023-01-13 DIAGNOSIS — Z23 Encounter for immunization: Secondary | ICD-10-CM

## 2023-01-13 LAB — TSH: TSH: 0.39 u[IU]/mL (ref 0.35–5.50)

## 2023-01-13 LAB — CBC WITH DIFFERENTIAL/PLATELET
Basophils Absolute: 0 10*3/uL (ref 0.0–0.1)
Basophils Relative: 0.2 % (ref 0.0–3.0)
Eosinophils Absolute: 0 10*3/uL (ref 0.0–0.7)
Eosinophils Relative: 0.1 % (ref 0.0–5.0)
HCT: 42.4 % (ref 39.0–52.0)
Hemoglobin: 13.4 g/dL (ref 13.0–17.0)
Lymphocytes Relative: 25.1 % (ref 12.0–46.0)
Lymphs Abs: 2.1 10*3/uL (ref 0.7–4.0)
MCHC: 31.5 g/dL (ref 30.0–36.0)
MCV: 73.2 fL — ABNORMAL LOW (ref 78.0–100.0)
Monocytes Absolute: 0.4 10*3/uL (ref 0.1–1.0)
Monocytes Relative: 4.6 % (ref 3.0–12.0)
Neutro Abs: 5.9 10*3/uL (ref 1.4–7.7)
Neutrophils Relative %: 70 % (ref 43.0–77.0)
Platelets: 367 10*3/uL (ref 150.0–400.0)
RBC: 5.79 Mil/uL (ref 4.22–5.81)
RDW: 16.8 % — ABNORMAL HIGH (ref 11.5–15.5)
WBC: 8.4 10*3/uL (ref 4.0–10.5)

## 2023-01-13 LAB — BASIC METABOLIC PANEL
BUN: 18 mg/dL (ref 6–23)
CO2: 29 meq/L (ref 19–32)
Calcium: 10 mg/dL (ref 8.4–10.5)
Chloride: 97 meq/L (ref 96–112)
Creatinine, Ser: 0.91 mg/dL (ref 0.40–1.50)
GFR: 95.86 mL/min (ref >60.00)
Glucose, Bld: 103 mg/dL — ABNORMAL HIGH (ref 70–99)
Potassium: 4.7 meq/L (ref 3.5–5.1)
Sodium: 137 meq/L (ref 135–145)

## 2023-01-13 LAB — T3, FREE: T3, Free: 3.8 pg/mL (ref 2.3–4.2)

## 2023-01-13 LAB — HEPATIC FUNCTION PANEL
ALT: 14 U/L (ref 0–53)
AST: 16 U/L (ref 0–37)
Albumin: 5.4 g/dL — ABNORMAL HIGH (ref 3.5–5.2)
Alkaline Phosphatase: 52 U/L (ref 39–117)
Bilirubin, Direct: 0.2 mg/dL (ref 0.0–0.3)
Total Bilirubin: 1.3 mg/dL — ABNORMAL HIGH (ref 0.2–1.2)
Total Protein: 7.8 g/dL (ref 6.0–8.3)

## 2023-01-13 LAB — LIPID PANEL
Cholesterol: 233 mg/dL — ABNORMAL HIGH (ref 0–200)
HDL: 124.5 mg/dL (ref >39.00)
LDL Cholesterol: 93 mg/dL (ref 0–99)
NonHDL: 108.86
Total CHOL/HDL Ratio: 2
Triglycerides: 77 mg/dL (ref 0.0–149.0)
VLDL: 15.4 mg/dL (ref 0.0–40.0)

## 2023-01-13 LAB — T4, FREE: Free T4: 0.69 ng/dL (ref 0.60–1.60)

## 2023-01-13 LAB — HEMOGLOBIN A1C: Hgb A1c MFr Bld: 6.1 % (ref 4.6–6.5)

## 2023-01-13 LAB — PSA: PSA: 0.07 ng/mL — ABNORMAL LOW (ref 0.10–4.00)

## 2023-01-13 MED ORDER — LEVOTHYROXINE SODIUM 75 MCG PO TABS
75.0000 ug | ORAL_TABLET | Freq: Every day | ORAL | 3 refills | Status: DC
  Filled 2023-01-13: qty 90, 90d supply, fill #0
  Filled 2023-05-15 (×2): qty 90, 90d supply, fill #1
  Filled 2023-08-13: qty 30, 30d supply, fill #2

## 2023-01-13 NOTE — Progress Notes (Signed)
 Subjective:    Patient ID: James Bradley, male    DOB: 1970-09-29, 52 y.o.   MRN: [id number]  HPI Here for a well exam. He is doing well. He is in a pain management program with us  for low back pain.    Review of Systems  Constitutional: Negative.   HENT: Negative.    Eyes: Negative.   Respiratory: Negative.    Cardiovascular: Negative.   Gastrointestinal: Negative.   Genitourinary: Negative.   Musculoskeletal:  Positive for back pain.  Skin: Negative.   Neurological: Negative.   Psychiatric/Behavioral: Negative.         Objective:   Physical Exam Constitutional:      General: He is not in acute distress.    Appearance: Normal appearance. He is well-developed. He is not diaphoretic.  HENT:     Head: Normocephalic and atraumatic.     Right Ear: External ear normal.     Left Ear: External ear normal.     Nose: Nose normal.     Mouth/Throat:     Pharynx: No oropharyngeal exudate.  Eyes:     General: No scleral icterus.       Right eye: No discharge.        Left eye: No discharge.     Conjunctiva/sclera: Conjunctivae normal.     Pupils: Pupils are equal, round, and reactive to light.  Neck:     Thyroid : No thyromegaly.     Vascular: No JVD.     Trachea: No tracheal deviation.  Cardiovascular:     Rate and Rhythm: Normal rate and regular rhythm.     Pulses: Normal pulses.     Heart sounds: Normal heart sounds. No murmur heard.    No friction rub. No gallop.  Pulmonary:     Effort: Pulmonary effort is normal. No respiratory distress.     Breath sounds: Normal breath sounds. No wheezing or rales.  Chest:     Chest wall: No tenderness.  Abdominal:     General: Bowel sounds are normal. There is no distension.     Palpations: Abdomen is soft. There is no mass.     Tenderness: There is no abdominal tenderness. There is no guarding or rebound.  Genitourinary:    Penis: Normal. No tenderness.      Testes: Normal.  Musculoskeletal:        General: No tenderness.  Normal range of motion.     Cervical back: Neck supple.  Lymphadenopathy:     Cervical: No cervical adenopathy.  Skin:    General: Skin is warm and dry.     Coloration: Skin is not pale.     Findings: No erythema or rash.  Neurological:     General: No focal deficit present.     Mental Status: He is alert and oriented to person, place, and time.     Cranial Nerves: No cranial nerve deficit.     Motor: No abnormal muscle tone.     Coordination: Coordination normal.     Deep Tendon Reflexes: Reflexes are normal and symmetric. Reflexes normal.  Psychiatric:        Mood and Affect: Mood normal.        Behavior: Behavior normal.        Thought Content: Thought content normal.        Judgment: Judgment normal.           Assessment & Plan:  Well exam. We discussed diet and exercise. Get fasting labs.  Set up another colonoscopy.  Garnette Olmsted, MD

## 2023-01-13 NOTE — Addendum Note (Signed)
Addended by: Carola Rhine on: 01/13/2023 11:11 AM   Modules accepted: Orders

## 2023-01-15 ENCOUNTER — Other Ambulatory Visit (HOSPITAL_BASED_OUTPATIENT_CLINIC_OR_DEPARTMENT_OTHER): Payer: Self-pay

## 2023-01-16 LAB — DRUG MONITOR, PANEL 1, W/CONF, URINE
Alphahydroxyalprazolam: 112 ng/mL — ABNORMAL HIGH (ref <25)
Alphahydroxymidazolam: NEGATIVE ng/mL (ref <50)
Alphahydroxytriazolam: NEGATIVE ng/mL (ref <50)
Aminoclonazepam: NEGATIVE ng/mL (ref <25)
Amphetamine: 1004 ng/mL — ABNORMAL HIGH (ref <250)
Amphetamines: POSITIVE ng/mL — AB (ref <500)
Barbiturates: NEGATIVE ng/mL (ref <300)
Benzodiazepines: POSITIVE ng/mL — AB (ref <100)
Cocaine Metabolite: NEGATIVE ng/mL (ref <150)
Codeine: NEGATIVE ng/mL (ref <50)
Creatinine: 36.4 mg/dL (ref >=20.0)
Hydrocodone: 866 ng/mL — ABNORMAL HIGH (ref <50)
Hydromorphone: NEGATIVE ng/mL (ref <50)
Hydroxyethylflurazepam: NEGATIVE ng/mL (ref <50)
Lorazepam: NEGATIVE ng/mL (ref <50)
Marijuana Metabolite: NEGATIVE ng/mL (ref <20)
Methadone Metabolite: NEGATIVE ng/mL (ref <100)
Methamphetamine: NEGATIVE ng/mL (ref <250)
Morphine: NEGATIVE ng/mL (ref <50)
Nordiazepam: NEGATIVE ng/mL (ref <50)
Norhydrocodone: 1129 ng/mL — ABNORMAL HIGH (ref <50)
Opiates: POSITIVE ng/mL — AB (ref <100)
Oxazepam: NEGATIVE ng/mL (ref <50)
Oxidant: NEGATIVE ug/mL (ref <200)
Oxycodone: NEGATIVE ng/mL (ref <100)
Phencyclidine: NEGATIVE ng/mL (ref <25)
Temazepam: NEGATIVE ng/mL (ref <50)
pH: 6.1 (ref 4.5–9.0)

## 2023-01-16 LAB — DM TEMPLATE

## 2023-01-20 ENCOUNTER — Other Ambulatory Visit: Payer: Self-pay

## 2023-01-20 ENCOUNTER — Other Ambulatory Visit (HOSPITAL_BASED_OUTPATIENT_CLINIC_OR_DEPARTMENT_OTHER): Payer: Self-pay

## 2023-01-21 ENCOUNTER — Other Ambulatory Visit: Payer: Self-pay

## 2023-01-21 ENCOUNTER — Other Ambulatory Visit (HOSPITAL_BASED_OUTPATIENT_CLINIC_OR_DEPARTMENT_OTHER): Payer: Self-pay

## 2023-01-22 ENCOUNTER — Other Ambulatory Visit (HOSPITAL_BASED_OUTPATIENT_CLINIC_OR_DEPARTMENT_OTHER): Payer: Self-pay

## 2023-02-02 ENCOUNTER — Other Ambulatory Visit (HOSPITAL_BASED_OUTPATIENT_CLINIC_OR_DEPARTMENT_OTHER): Payer: Self-pay

## 2023-02-18 ENCOUNTER — Other Ambulatory Visit (HOSPITAL_BASED_OUTPATIENT_CLINIC_OR_DEPARTMENT_OTHER): Payer: Self-pay

## 2023-02-18 ENCOUNTER — Telehealth: Payer: Self-pay

## 2023-02-18 NOTE — Telephone Encounter (Signed)
 Pharmacy Patient Advocate Encounter   Received notification from CoverMyMeds that prior authorization for  oxyCODONE -Acetaminophen  10-325MG  tablets is required/requested.   Insurance verification completed.   The patient is insured through [facility] .   Per test claim: PA required; PA started via CoverMyMeds. KEY [id number] . Waiting for clinical questions to populate.

## 2023-02-20 ENCOUNTER — Other Ambulatory Visit (HOSPITAL_COMMUNITY): Payer: Self-pay

## 2023-02-20 ENCOUNTER — Other Ambulatory Visit: Payer: Self-pay

## 2023-02-20 ENCOUNTER — Other Ambulatory Visit (HOSPITAL_BASED_OUTPATIENT_CLINIC_OR_DEPARTMENT_OTHER): Payer: Self-pay

## 2023-02-20 NOTE — Telephone Encounter (Signed)
 Pharmacy Patient Advocate Encounter   Received notification from CoverMyMeds that prior authorization for oxyCODONE -Acetaminophen  10-325MG  tablets is required/requested.   Insurance verification completed.   The patient is insured through [facility] .   Per test claim: PA required; PA submitted to above mentioned insurance via CoverMyMeds Key/confirmation #/EOC [id number] Status is pending

## 2023-03-04 ENCOUNTER — Other Ambulatory Visit (HOSPITAL_BASED_OUTPATIENT_CLINIC_OR_DEPARTMENT_OTHER): Payer: Self-pay

## 2023-03-09 ENCOUNTER — Encounter: Payer: Self-pay | Admitting: Family Medicine

## 2023-03-09 ENCOUNTER — Other Ambulatory Visit (HOSPITAL_BASED_OUTPATIENT_CLINIC_OR_DEPARTMENT_OTHER): Payer: Self-pay

## 2023-03-09 ENCOUNTER — Telehealth (INDEPENDENT_AMBULATORY_CARE_PROVIDER_SITE_OTHER): Payer: Self-pay | Admitting: Family Medicine

## 2023-03-09 DIAGNOSIS — G8929 Other chronic pain: Secondary | ICD-10-CM

## 2023-03-09 DIAGNOSIS — M5440 Lumbago with sciatica, unspecified side: Secondary | ICD-10-CM

## 2023-03-09 DIAGNOSIS — F1190 Opioid use, unspecified, uncomplicated: Secondary | ICD-10-CM

## 2023-03-09 MED ORDER — OXYCODONE-ACETAMINOPHEN 10-325 MG PO TABS
1.0000 | ORAL_TABLET | Freq: Four times a day (QID) | ORAL | 0 refills | Status: DC | PRN
  Filled 2023-03-09 – 2023-03-20 (×2): qty 120, 30d supply, fill #0

## 2023-03-09 MED ORDER — OXYCODONE-ACETAMINOPHEN 10-325 MG PO TABS
1.0000 | ORAL_TABLET | Freq: Four times a day (QID) | ORAL | 0 refills | Status: DC | PRN
  Filled 2023-03-09 – 2023-04-17 (×2): qty 120, 30d supply, fill #0

## 2023-03-09 MED ORDER — AMPHETAMINE-DEXTROAMPHETAMINE 20 MG PO TABS
20.0000 mg | ORAL_TABLET | Freq: Every day | ORAL | 0 refills | Status: DC
Start: 2023-03-09 — End: 2023-08-13
  Filled 2023-03-09 – 2023-05-12 (×3): qty 30, 30d supply, fill #0

## 2023-03-09 MED ORDER — OMEPRAZOLE 40 MG PO CPDR
40.0000 mg | DELAYED_RELEASE_CAPSULE | Freq: Every day | ORAL | 3 refills | Status: DC
  Filled 2023-03-09: qty 90, 90d supply, fill #0

## 2023-03-09 MED ORDER — OXYCODONE-ACETAMINOPHEN 10-325 MG PO TABS
1.0000 | ORAL_TABLET | Freq: Four times a day (QID) | ORAL | 0 refills | Status: DC | PRN
  Filled 2023-03-09 – 2023-05-20 (×3): qty 120, 30d supply, fill #0

## 2023-03-09 MED ORDER — AMPHETAMINE-DEXTROAMPHETAMINE 20 MG PO TABS
20.0000 mg | ORAL_TABLET | Freq: Every day | ORAL | 0 refills | Status: DC
  Filled 2023-03-09 – 2023-06-10 (×3): qty 30, 30d supply, fill #0

## 2023-03-09 MED ORDER — AMPHETAMINE-DEXTROAMPHETAMINE 20 MG PO TABS
20.0000 mg | ORAL_TABLET | Freq: Every day | ORAL | 0 refills | Status: DC
  Filled 2023-03-09 – 2023-04-10 (×2): qty 30, 30d supply, fill #0

## 2023-03-09 NOTE — Progress Notes (Signed)
 Subjective:    Patient ID: James Bradley, male    DOB: March 03, 1970, 53 y.o.   MRN: [id number]  HPI Virtual Visit via Video Note  I connected with the patient on 03/09/23 at  2:15 PM EST by a video enabled telemedicine application and verified that I am speaking with the correct person using two identifiers.  Location patient: home Location provider:work or home office Persons participating in the virtual visit: patient, provider  I discussed the limitations of evaluation and management by telemedicine and the availability of in person appointments. The patient expressed understanding and agreed to proceed.   HPI: Here for pain management, he is doing well .   ROS: See pertinent positives and negatives per HPI.  Past Medical History:  Diagnosis Date   Anxiety    Cancer [facility])    prostate , sees Dr. Annabell Howells    GERD (gastroesophageal reflux disease)    Herpes labialis    Insomnia    Low back pain    Onychomycosis    Prostatitis     Past Surgical History:  Procedure Laterality Date   COLONOSCOPY  August 2013    per Dr. Jeani Hawking , clear, repeat in 10 yrs    ESI Dr Ophelia Charter     microdiskectomy  2009   at L5-S1, per Dr. Zenon Mayo    PROSTATECTOMY  08-20-10   per Dr. Annabell Howells    Family History  Problem Relation Age of Onset   Arthritis Other    Diabetes Other      Current Outpatient Medications:    ALPRAZolam (XANAX) 1 MG tablet, Take 1 tablet (1 mg total) by mouth 3 (three) times daily as needed for anxiety. for anxiety, Disp: 90 tablet, Rfl: 5   levothyroxine (SYNTHROID) 75 MCG tablet, Take 1 tablet (75 mcg total) by mouth daily., Disp: 90 tablet, Rfl: 3   predniSONE (DELTASONE) 10 MG tablet, Take 1 tablet (10 mg total) by mouth 2 (two) times daily as needed (pain)., Disp: 60 tablet, Rfl: 5   valACYclovir (VALTREX) 500 MG tablet, Take 1 tablet (500 mg total) by mouth 2 (two) times daily as needed (cold sores)., Disp: 60 tablet, Rfl: 11   zolpidem (AMBIEN) 10 MG  tablet, Take 1 tablet (10 mg total) by mouth at bedtime as needed for sleep., Disp: 30 tablet, Rfl: 5   amphetamine-dextroamphetamine (ADDERALL) 20 MG tablet, Take 1 tablet (20 mg total) by mouth daily., Disp: 30 tablet, Rfl: 0   amphetamine-dextroamphetamine (ADDERALL) 20 MG tablet, Take 1 tablet (20 mg total) by mouth daily., Disp: 30 tablet, Rfl: 0   amphetamine-dextroamphetamine (ADDERALL) 20 MG tablet, Take 1 tablet (20 mg total) by mouth daily., Disp: 30 tablet, Rfl: 0   omeprazole (PRILOSEC) 40 MG capsule, Take 1 capsule (40 mg total) by mouth daily., Disp: 90 capsule, Rfl: 3   oxyCODONE-acetaminophen (PERCOCET) 10-325 MG tablet, Take 1 tablet by mouth every 6 (six) hours as needed for pain., Disp: 120 tablet, Rfl: 0   oxyCODONE-acetaminophen (PERCOCET) 10-325 MG tablet, Take 1 tablet by mouth every 6 (six) hours as needed for pain., Disp: 120 tablet, Rfl: 0   oxyCODONE-acetaminophen (PERCOCET) 10-325 MG tablet, Take 1 tablet by mouth every 6 (six) hours as needed for pain., Disp: 120 tablet, Rfl: 0  EXAM:  VITALS per patient if applicable:  GENERAL: alert, oriented, appears well and in no acute distress  HEENT: atraumatic, conjunttiva clear, no obvious abnormalities on inspection of external nose and ears  NECK: normal movements  of the head and neck  LUNGS: on inspection no signs of respiratory distress, breathing rate appears normal, no obvious gross SOB, gasping or wheezing  CV: no obvious cyanosis  MS: moves all visible extremities without noticeable abnormality  PSYCH/NEURO: pleasant and cooperative, no obvious depression or anxiety, speech and thought processing grossly intact  ASSESSMENT AND PLAN: Pain management. Indication for chronic opioid: low back pain Medication and dose: Percocet 10-325 # pills per month: 120 Last UDS date: 01-13-23 Opioid Treatment Agreement signed (Y/N): 04-19-17 Opioid Treatment Agreement last reviewed with patient:  03-09-23 [facility] reviewed  this encounter (include red flags): Yes Meds were refilled.  Gershon Crane, MD  Discussed the following assessment and plan:  No diagnosis found.     I discussed the assessment and treatment plan with the patient. The patient was provided an opportunity to ask questions and all were answered. The patient agreed with the plan and demonstrated an understanding of the instructions.   The patient was advised to call back or seek an in-person evaluation if the symptoms worsen or if the condition fails to improve as anticipated.      Review of Systems     Objective:   Physical Exam        Assessment & Plan:

## 2023-03-14 ENCOUNTER — Other Ambulatory Visit (HOSPITAL_BASED_OUTPATIENT_CLINIC_OR_DEPARTMENT_OTHER): Payer: Self-pay

## 2023-03-18 ENCOUNTER — Other Ambulatory Visit (HOSPITAL_BASED_OUTPATIENT_CLINIC_OR_DEPARTMENT_OTHER): Payer: Self-pay

## 2023-03-18 ENCOUNTER — Other Ambulatory Visit: Payer: Self-pay | Admitting: Family Medicine

## 2023-03-19 ENCOUNTER — Other Ambulatory Visit (HOSPITAL_BASED_OUTPATIENT_CLINIC_OR_DEPARTMENT_OTHER): Payer: Self-pay

## 2023-03-19 ENCOUNTER — Other Ambulatory Visit: Payer: Self-pay

## 2023-03-19 MED ORDER — ALPRAZOLAM 1 MG PO TABS
1.0000 mg | ORAL_TABLET | Freq: Three times a day (TID) | ORAL | 5 refills | Status: DC | PRN
  Filled 2023-03-19 – 2023-04-06 (×2): qty 90, 30d supply, fill #0
  Filled 2023-05-12 (×2): qty 90, 30d supply, fill #1
  Filled 2023-06-19: qty 90, 30d supply, fill #2
  Filled 2023-07-20: qty 90, 30d supply, fill #3
  Filled 2023-08-18: qty 90, 30d supply, fill #4

## 2023-03-19 MED ORDER — ZOLPIDEM TARTRATE 10 MG PO TABS
10.0000 mg | ORAL_TABLET | Freq: Every evening | ORAL | 5 refills | Status: DC | PRN
  Filled 2023-03-19: qty 30, 30d supply, fill #0
  Filled 2023-04-17 (×2): qty 30, 30d supply, fill #1
  Filled 2023-05-15: qty 30, 30d supply, fill #2
  Filled 2023-06-19: qty 30, 30d supply, fill #3
  Filled 2023-08-13: qty 30, 30d supply, fill #4

## 2023-03-20 ENCOUNTER — Other Ambulatory Visit (HOSPITAL_BASED_OUTPATIENT_CLINIC_OR_DEPARTMENT_OTHER): Payer: Self-pay

## 2023-04-06 ENCOUNTER — Other Ambulatory Visit (HOSPITAL_BASED_OUTPATIENT_CLINIC_OR_DEPARTMENT_OTHER): Payer: Self-pay

## 2023-04-10 ENCOUNTER — Other Ambulatory Visit (HOSPITAL_BASED_OUTPATIENT_CLINIC_OR_DEPARTMENT_OTHER): Payer: Self-pay

## 2023-04-15 ENCOUNTER — Other Ambulatory Visit (HOSPITAL_BASED_OUTPATIENT_CLINIC_OR_DEPARTMENT_OTHER): Payer: Self-pay

## 2023-04-17 ENCOUNTER — Other Ambulatory Visit: Payer: Self-pay | Admitting: Family Medicine

## 2023-04-17 ENCOUNTER — Other Ambulatory Visit: Payer: Self-pay

## 2023-04-17 ENCOUNTER — Other Ambulatory Visit (HOSPITAL_COMMUNITY): Payer: Self-pay

## 2023-04-17 ENCOUNTER — Other Ambulatory Visit (HOSPITAL_BASED_OUTPATIENT_CLINIC_OR_DEPARTMENT_OTHER): Payer: Self-pay

## 2023-04-17 MED ORDER — OXYCODONE-ACETAMINOPHEN 10-325 MG PO TABS
1.0000 | ORAL_TABLET | Freq: Four times a day (QID) | ORAL | 0 refills | Status: DC | PRN
  Filled 2023-04-17: qty 120, 30d supply, fill #0

## 2023-04-17 NOTE — Telephone Encounter (Signed)
 Copied from CRM [id number]. Topic: Clinical - Medication Refill >> Apr 17, 2023  7:43 AM Marica Otter wrote: Most Recent Primary Care Visit:  Provider: Gershon Crane A  Department: [facility]  Visit Type: MYCHART VIDEO VISIT  Date: 03/09/2023  Medication: oxyCODONE-acetaminophen (PERCOCET) 10-325 MG tablet  Has the patient contacted their pharmacy? Yes, needs provider authorization  (Agent: If no, request that the patient contact the pharmacy for the refill. If patient does not wish to contact the pharmacy document the reason why and proceed with request.) (Agent: If yes, when and what did the pharmacy advise?)  Is this the correct pharmacy for this prescription? Yes If no, delete pharmacy and type the correct one.  This is the patient's preferred pharmacy:  [facility] [address] [place] [address] Phone: [phone] Fax: [phone]   Has the prescription been filled recently? No  Is the patient out of the medication? Yes  Has the patient been seen for an appointment in the last year OR does the patient have an upcoming appointment? Yes  Can we respond through MyChart? Yes  Agent: Please be advised that Rx refills may take up to 3 business days. We ask that you follow-up with your pharmacy.

## 2023-04-17 NOTE — Telephone Encounter (Signed)
 Done

## 2023-05-12 ENCOUNTER — Other Ambulatory Visit (HOSPITAL_BASED_OUTPATIENT_CLINIC_OR_DEPARTMENT_OTHER): Payer: Self-pay

## 2023-05-12 ENCOUNTER — Other Ambulatory Visit (HOSPITAL_COMMUNITY): Payer: Self-pay

## 2023-05-15 ENCOUNTER — Other Ambulatory Visit (HOSPITAL_COMMUNITY): Payer: Self-pay

## 2023-05-15 ENCOUNTER — Other Ambulatory Visit (HOSPITAL_BASED_OUTPATIENT_CLINIC_OR_DEPARTMENT_OTHER): Payer: Self-pay

## 2023-05-20 ENCOUNTER — Other Ambulatory Visit (HOSPITAL_BASED_OUTPATIENT_CLINIC_OR_DEPARTMENT_OTHER): Payer: Self-pay

## 2023-05-21 ENCOUNTER — Other Ambulatory Visit (HOSPITAL_BASED_OUTPATIENT_CLINIC_OR_DEPARTMENT_OTHER): Payer: Self-pay

## 2023-05-21 ENCOUNTER — Encounter: Payer: Self-pay | Admitting: Family Medicine

## 2023-05-21 ENCOUNTER — Telehealth (INDEPENDENT_AMBULATORY_CARE_PROVIDER_SITE_OTHER): Admitting: Family Medicine

## 2023-05-21 DIAGNOSIS — M25552 Pain in left hip: Secondary | ICD-10-CM

## 2023-05-21 DIAGNOSIS — F1190 Opioid use, unspecified, uncomplicated: Secondary | ICD-10-CM | POA: Diagnosis not present

## 2023-05-21 DIAGNOSIS — G8929 Other chronic pain: Secondary | ICD-10-CM

## 2023-05-21 DIAGNOSIS — M5441 Lumbago with sciatica, right side: Secondary | ICD-10-CM | POA: Diagnosis not present

## 2023-05-21 DIAGNOSIS — M5442 Lumbago with sciatica, left side: Secondary | ICD-10-CM

## 2023-05-21 MED ORDER — PREDNISONE 10 MG PO TABS
10.0000 mg | ORAL_TABLET | Freq: Two times a day (BID) | ORAL | 0 refills | Status: DC | PRN
  Filled 2023-05-21 – 2023-06-22 (×2): qty 60, 30d supply, fill #0

## 2023-05-21 MED ORDER — OXYCODONE-ACETAMINOPHEN 10-325 MG PO TABS
1.0000 | ORAL_TABLET | Freq: Four times a day (QID) | ORAL | 0 refills | Status: DC | PRN
Start: 2023-07-21 — End: 2023-09-02
  Filled 2023-05-21 – 2023-08-19 (×2): qty 120, 30d supply, fill #0

## 2023-05-21 MED ORDER — OXYCODONE-ACETAMINOPHEN 10-325 MG PO TABS
1.0000 | ORAL_TABLET | Freq: Four times a day (QID) | ORAL | 0 refills | Status: DC | PRN
  Filled 2023-05-21 – 2023-07-22 (×2): qty 120, 30d supply, fill #0

## 2023-05-21 MED ORDER — OXYCODONE-ACETAMINOPHEN 10-325 MG PO TABS
1.0000 | ORAL_TABLET | Freq: Four times a day (QID) | ORAL | 0 refills | Status: DC | PRN
  Filled 2023-05-21 – 2023-06-22 (×2): qty 120, 30d supply, fill #0

## 2023-05-21 NOTE — Progress Notes (Signed)
 Subjective:    Patient ID: James Bradley, male    DOB: 1970-09-02, 53 y.o.   MRN: [id number]  HPI Virtual Visit via Video Note  I connected with the patient on 05/21/23 at  8:00 AM EDT by a video enabled telemedicine application and verified that I am speaking with the correct person using two identifiers.  Location patient: home Location provider:work or home office Persons participating in the virtual visit: patient, provider  I discussed the limitations of evaluation and management by telemedicine and the availability of in person appointments. The patient expressed understanding and agreed to proceed.   HPI: Here for pain management. His low back pain is about the same, but he has been having much worse pain in the left hip. He had Xrays on 10-18-20 which showed arthritis in both hips, but there was also mention of possible AVN in the left femoral head. At that time I recommended he have an MRI done on the hip to get more information, but he declined and said he wanted to wait on this.    ROS: See pertinent positives and negatives per HPI.  Past Medical History:  Diagnosis Date   Anxiety    Cancer [facility])    prostate , sees Dr. Inga Manges    GERD (gastroesophageal reflux disease)    Herpes labialis    Insomnia    Low back pain    Onychomycosis    Prostatitis     Past Surgical History:  Procedure Laterality Date   COLONOSCOPY  August 2013    per Dr. Alvis Jourdain , clear, repeat in 10 yrs    ESI Dr Murrel Arnt     microdiskectomy  2009   at L5-S1, per Dr. Mickle Albe    PROSTATECTOMY  08-20-10   per Dr. Inga Manges    Family History  Problem Relation Age of Onset   Arthritis Other    Diabetes Other      Current Outpatient Medications:    ALPRAZolam  (XANAX ) 1 MG tablet, Take 1 tablet (1 mg total) by mouth 3 (three) times daily as needed for anxiety. for anxiety, Disp: 90 tablet, Rfl: 5   amphetamine -dextroamphetamine  (ADDERALL) 20 MG tablet, Take 1 tablet (20 mg total) by mouth  daily., Disp: 30 tablet, Rfl: 0   amphetamine -dextroamphetamine  (ADDERALL) 20 MG tablet, Take 1 tablet (20 mg total) by mouth daily., Disp: 30 tablet, Rfl: 0   amphetamine -dextroamphetamine  (ADDERALL) 20 MG tablet, Take 1 tablet (20 mg total) by mouth daily., Disp: 30 tablet, Rfl: 0   levothyroxine  (SYNTHROID ) 75 MCG tablet, Take 1 tablet (75 mcg total) by mouth daily., Disp: 90 tablet, Rfl: 3   omeprazole  (PRILOSEC) 40 MG capsule, Take 1 capsule (40 mg total) by mouth daily., Disp: 90 capsule, Rfl: 3   oxyCODONE -acetaminophen  (PERCOCET ) 10-325 MG tablet, Take 1 tablet by mouth every 6 (six) hours as needed for pain., Disp: 120 tablet, Rfl: 0   oxyCODONE -acetaminophen  (PERCOCET ) 10-325 MG tablet, Take 1 tablet by mouth every 6 (six) hours as needed for pain., Disp: 120 tablet, Rfl: 0   oxyCODONE -acetaminophen  (PERCOCET ) 10-325 MG tablet, Take 1 tablet by mouth every 6 (six) hours as needed for pain., Disp: 120 tablet, Rfl: 0   predniSONE  (DELTASONE ) 10 MG tablet, Take 1 tablet (10 mg total) by mouth 2 (two) times daily as needed (pain)., Disp: 60 tablet, Rfl: 5   valACYclovir  (VALTREX ) 500 MG tablet, Take 1 tablet (500 mg total) by mouth 2 (two) times daily as needed (cold sores)., Disp:  60 tablet, Rfl: 11   zolpidem  (AMBIEN ) 10 MG tablet, Take 1 tablet (10 mg total) by mouth at bedtime as needed for sleep., Disp: 30 tablet, Rfl: 5  EXAM:  VITALS per patient if applicable:  GENERAL: alert, oriented, appears well and in no acute distress  HEENT: atraumatic, conjunttiva clear, no obvious abnormalities on inspection of external nose and ears  NECK: normal movements of the head and neck  LUNGS: on inspection no signs of respiratory distress, breathing rate appears normal, no obvious gross SOB, gasping or wheezing  CV: no obvious cyanosis  MS: moves all visible extremities without noticeable abnormality  PSYCH/NEURO: pleasant and cooperative, no obvious depression or anxiety, speech and  thought processing grossly intact  ASSESSMENT AND PLAN: Pain management. Indication for chronic opioid: low back pain Medication and dose: Percocet  10-325 # pills per month: 120 Last UDS date: 01-13-23 Opioid Treatment Agreement signed (Y/N): 04-19-17 Opioid Treatment Agreement last reviewed with patient:  05-21-23 [facility] reviewed this encounter (include red flags):  Yes  Meds were refilled. We will set up an MRI of the left hip soon.  Corita Diego, MD  Discussed the following assessment and plan:  No diagnosis found.     I discussed the assessment and treatment plan with the patient. The patient was provided an opportunity to ask questions and all were answered. The patient agreed with the plan and demonstrated an understanding of the instructions.   The patient was advised to call back or seek an in-person evaluation if the symptoms worsen or if the condition fails to improve as anticipated.      Review of Systems     Objective:   Physical Exam        Assessment & Plan:

## 2023-06-04 ENCOUNTER — Other Ambulatory Visit (HOSPITAL_BASED_OUTPATIENT_CLINIC_OR_DEPARTMENT_OTHER): Payer: Self-pay

## 2023-06-10 ENCOUNTER — Other Ambulatory Visit (HOSPITAL_BASED_OUTPATIENT_CLINIC_OR_DEPARTMENT_OTHER): Payer: Self-pay

## 2023-06-19 ENCOUNTER — Telehealth: Payer: Self-pay | Admitting: Family Medicine

## 2023-06-19 ENCOUNTER — Other Ambulatory Visit (HOSPITAL_BASED_OUTPATIENT_CLINIC_OR_DEPARTMENT_OTHER): Payer: Self-pay

## 2023-06-19 NOTE — Telephone Encounter (Signed)
 Left a voice message for pt regarding work note.

## 2023-06-19 NOTE — Telephone Encounter (Signed)
 The letter is ready

## 2023-06-19 NOTE — Telephone Encounter (Unsigned)
 Copied from CRM [id number]. Topic: General - Other >> Jun 19, 2023  7:42 AM Freya Jesus wrote: Reason for CRM: Patient said that he took Ambien  late last night and woke up feeling  so he called out of work today and his employer is requesting a note from provider that he takes Ambien . Patient stated he can pick up the note when ready or we can upload to MyChart.

## 2023-06-22 ENCOUNTER — Other Ambulatory Visit (HOSPITAL_BASED_OUTPATIENT_CLINIC_OR_DEPARTMENT_OTHER): Payer: Self-pay

## 2023-07-16 ENCOUNTER — Other Ambulatory Visit (HOSPITAL_BASED_OUTPATIENT_CLINIC_OR_DEPARTMENT_OTHER): Payer: Self-pay

## 2023-07-20 ENCOUNTER — Other Ambulatory Visit (HOSPITAL_BASED_OUTPATIENT_CLINIC_OR_DEPARTMENT_OTHER): Payer: Self-pay

## 2023-07-22 ENCOUNTER — Other Ambulatory Visit (HOSPITAL_BASED_OUTPATIENT_CLINIC_OR_DEPARTMENT_OTHER): Payer: Self-pay

## 2023-08-13 ENCOUNTER — Other Ambulatory Visit (HOSPITAL_BASED_OUTPATIENT_CLINIC_OR_DEPARTMENT_OTHER): Payer: Self-pay

## 2023-08-13 ENCOUNTER — Other Ambulatory Visit: Payer: Self-pay

## 2023-08-13 ENCOUNTER — Other Ambulatory Visit: Payer: Self-pay | Admitting: Family Medicine

## 2023-08-13 MED ORDER — PREDNISONE 10 MG PO TABS
10.0000 mg | ORAL_TABLET | Freq: Two times a day (BID) | ORAL | 5 refills | Status: DC | PRN
Start: 2023-08-13 — End: 2023-09-02
  Filled 2023-08-13: qty 60, 30d supply, fill #0

## 2023-08-13 MED ORDER — AMPHETAMINE-DEXTROAMPHETAMINE 20 MG PO TABS
20.0000 mg | ORAL_TABLET | Freq: Every day | ORAL | 0 refills | Status: DC
  Filled 2023-08-13: qty 30, 30d supply, fill #0

## 2023-08-13 MED ORDER — AMPHETAMINE-DEXTROAMPHETAMINE 20 MG PO TABS
20.0000 mg | ORAL_TABLET | Freq: Every day | ORAL | 0 refills | Status: DC
Start: 2023-09-12 — End: 2023-09-02
  Filled 2023-08-13: qty 30, 30d supply, fill #0

## 2023-08-13 NOTE — Telephone Encounter (Signed)
 Done

## 2023-08-18 ENCOUNTER — Other Ambulatory Visit (HOSPITAL_BASED_OUTPATIENT_CLINIC_OR_DEPARTMENT_OTHER): Payer: Self-pay

## 2023-08-18 ENCOUNTER — Other Ambulatory Visit: Payer: Self-pay

## 2023-08-18 NOTE — Telephone Encounter (Signed)
 Please call his pharmacy to okay a one time early refill

## 2023-08-19 ENCOUNTER — Other Ambulatory Visit: Payer: Self-pay

## 2023-08-19 ENCOUNTER — Other Ambulatory Visit (HOSPITAL_BASED_OUTPATIENT_CLINIC_OR_DEPARTMENT_OTHER): Payer: Self-pay

## 2023-09-02 ENCOUNTER — Telehealth: Admitting: Family Medicine

## 2023-09-02 DIAGNOSIS — M5441 Lumbago with sciatica, right side: Secondary | ICD-10-CM | POA: Diagnosis not present

## 2023-09-02 DIAGNOSIS — G8929 Other chronic pain: Secondary | ICD-10-CM

## 2023-09-02 DIAGNOSIS — M5442 Lumbago with sciatica, left side: Secondary | ICD-10-CM

## 2023-09-02 MED ORDER — OXYCODONE-ACETAMINOPHEN 10-325 MG PO TABS: 1.0000 | ORAL_TABLET | Freq: Four times a day (QID) | ORAL | 0 refills | Status: DC | PRN

## 2023-09-02 MED ORDER — LEVOTHYROXINE SODIUM 75 MCG PO TABS: 75.0000 ug | ORAL_TABLET | Freq: Every day | ORAL | 3 refills | Status: AC

## 2023-09-02 MED ORDER — ZOLPIDEM TARTRATE 10 MG PO TABS: 10.0000 mg | ORAL_TABLET | Freq: Every evening | ORAL | 5 refills | Status: AC | PRN

## 2023-09-02 MED ORDER — ALPRAZOLAM 1 MG PO TABS: 1.0000 mg | ORAL_TABLET | Freq: Three times a day (TID) | ORAL | 5 refills | Status: AC | PRN

## 2023-09-02 MED ORDER — AMPHETAMINE-DEXTROAMPHETAMINE 20 MG PO TABS: 20.0000 mg | ORAL_TABLET | Freq: Every day | ORAL | 0 refills | Status: DC

## 2023-09-02 MED ORDER — VALACYCLOVIR HCL 500 MG PO TABS: 500.0000 mg | ORAL_TABLET | Freq: Two times a day (BID) | ORAL | 11 refills | Status: AC | PRN

## 2023-09-02 MED ORDER — OMEPRAZOLE 40 MG PO CPDR: 40.0000 mg | DELAYED_RELEASE_CAPSULE | Freq: Every day | ORAL | 3 refills | Status: AC

## 2023-09-02 MED ORDER — PREDNISONE 10 MG PO TABS: 10.0000 mg | ORAL_TABLET | Freq: Two times a day (BID) | ORAL | 5 refills | Status: AC | PRN

## 2023-09-02 NOTE — Progress Notes (Signed)
 Subjective:    Patient ID: James Bradley, male    DOB: [birth year], 53 y.o.   MRN: [id number]  HPI Virtual Visit via Video Note  I connected with the patient on 09/02/23 at  4:00 PM EDT by a video enabled telemedicine application and verified that I am speaking with the correct person using two identifiers.  Location patient: home Location provider:work or home office Persons participating in the virtual visit: patient, provider  I discussed the limitations of evaluation and management by telemedicine and the availability of in person appointments. The patient expressed understanding and agreed to proceed.   HPI: Here for pain management. He is doing well. 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    ROS: See pertinent positives and negatives per HPI.  Past Medical History:  Diagnosis Date   Anxiety    Cancer [facility])    prostate , sees Dr. Watt    GERD (gastroesophageal reflux disease)    Herpes labialis    Insomnia    Low back pain    Onychomycosis    Prostatitis     Past Surgical History:  Procedure Laterality Date   COLONOSCOPY  August 2013    per Dr. Belvie Just , clear, repeat in 10 yrs    ESI Dr Barbarann     microdiskectomy  2009   at L5-S1, per Dr. Oneil Sole    PROSTATECTOMY  08-20-10   per Dr. Watt    Family History  Problem Relation Age of Onset   Arthritis Other    Diabetes Other      Current Outpatient Medications:    ALPRAZolam  (XANAX ) 1 MG tablet, Take 1 tablet (1 mg total) by mouth 3 (three) times daily as needed for anxiety. for anxiety, Disp: 90 tablet, Rfl: 5   amphetamine -dextroamphetamine  (ADDERALL) 20 MG tablet, Take 1 tablet (20 mg total) by mouth daily., Disp: 30 tablet, Rfl: 0   [START ON 09/12/2023] amphetamine -dextroamphetamine  (ADDERALL) 20 MG tablet, Take 1 tablet (20 mg total) by mouth daily., Disp: 30 tablet, Rfl: 0   [START ON 10/13/2023] amphetamine -dextroamphetamine  (ADDERALL) 20 MG tablet, Take 1 tablet (20 mg  total) by mouth daily., Disp: 30 tablet, Rfl: 0   levothyroxine  (SYNTHROID ) 75 MCG tablet, Take 1 tablet (75 mcg total) by mouth daily., Disp: 90 tablet, Rfl: 3   omeprazole  (PRILOSEC) 40 MG capsule, Take 1 capsule (40 mg total) by mouth daily., Disp: 90 capsule, Rfl: 3   oxyCODONE -acetaminophen  (PERCOCET ) 10-325 MG tablet, Take 1 tablet by mouth every 6 (six) hours as needed for pain., Disp: 120 tablet, Rfl: 0   oxyCODONE -acetaminophen  (PERCOCET ) 10-325 MG tablet, Take 1 tablet by mouth every 6 (six) hours as needed for pain., Disp: 120 tablet, Rfl: 0   oxyCODONE -acetaminophen  (PERCOCET ) 10-325 MG tablet, Take 1 tablet by mouth every 6 (six) hours as needed for pain., Disp: 120 tablet, Rfl: 0   predniSONE  (DELTASONE ) 10 MG tablet, Take 1 tablet (10 mg total) by mouth 2 (two) times daily as needed (pain)., Disp: 60 tablet, Rfl: 5   valACYclovir  (VALTREX ) 500 MG tablet, Take 1 tablet (500 mg total) by mouth 2 (two) times daily as needed (cold sores)., Disp: 60 tablet, Rfl: 11   zolpidem  (AMBIEN ) 10 MG tablet, Take 1 tablet (10 mg total) by mouth at bedtime as needed for sleep., Disp: 30 tablet, Rfl: 5  EXAM:  VITALS per patient if applicable:  GENERAL: alert, oriented, appears well and in no acute  distress  HEENT: atraumatic, conjunttiva clear, no obvious abnormalities on inspection of external nose and ears  NECK: normal movements of the head and neck  LUNGS: on inspection no signs of respiratory distress, breathing rate appears normal, no obvious gross SOB, gasping or wheezing  CV: no obvious cyanosis  MS: moves all visible extremities without noticeable abnormality  PSYCH/NEURO: pleasant and cooperative, no obvious depression or anxiety, speech and thought processing grossly intact  ASSESSMENT AND PLAN: Pain management. Indication for chronic opioid: low back pain Medication and dose: Percocet  10-325 # pills per month: 120 Last UDS date: 01-13-23 Opioid Treatment Agreement signed  (Y/N): 04-19-17 Opioid Treatment Agreement last reviewed with patient:  09-02-23 [facility] reviewed this encounter (include red flags): Yes Meds were refilled. Garnette Olmsted, MD   Discussed the following assessment and plan:  No diagnosis found.     I discussed the assessment and treatment plan with the patient. The patient was provided an opportunity to ask questions and all were answered. The patient agreed with the plan and demonstrated an understanding of the instructions.   The patient was advised to call back or seek an in-person evaluation if the symptoms worsen or if the condition fails to improve as anticipated.      Review of Systems     Objective:   Physical Exam        Assessment & Plan:

## 2023-09-17 ENCOUNTER — Telehealth: Payer: Self-pay

## 2023-09-17 NOTE — Telephone Encounter (Signed)
 Copied from CRM #[id number]. Topic: Clinical - Medication Prior Auth >> Sep 15, 2023  8:36 AM Rea ORN wrote: Reason for CRM: Pt called to advise he needs a prior auth for Oxycodone  10-325 mg sent to [facility] today. He stated that they need the qty 120 for 30 day supply before they dispense to him. Please fax to [phone]

## 2023-09-18 ENCOUNTER — Other Ambulatory Visit (HOSPITAL_COMMUNITY): Payer: Self-pay

## 2023-09-18 ENCOUNTER — Telehealth: Payer: Self-pay

## 2023-09-18 NOTE — Telephone Encounter (Signed)
Pt notified of PA approval

## 2023-09-18 NOTE — Telephone Encounter (Signed)
 Pharmacy Patient Advocate Encounter   Received notification from RX Request Messages that prior authorization for Percocet  10-325 tabs is required/requested.   Insurance verification completed.   The patient is insured through [employer] .   Per test claim: PA required; PA submitted to above mentioned insurance via Latent Key/confirmation #/EOC [id number] Status is pending

## 2023-09-18 NOTE — Telephone Encounter (Signed)
 Pharmacy Patient Advocate Encounter  Received notification from [facility] that Prior Authorization for Percocet  10-325 tabs has been APPROVED from 08/19/23 to 09/17/24. Ran test claim, Copay is $10.00. This test claim was processed through [facility]- copay amounts may vary at other pharmacies due to pharmacy/plan contracts, or as the patient moves through the different stages of their insurance plan.     PA #/Case ID/Reference #: [id number]

## 2023-10-25 ENCOUNTER — Encounter (INDEPENDENT_AMBULATORY_CARE_PROVIDER_SITE_OTHER): Payer: Self-pay

## 2023-12-04 ENCOUNTER — Telehealth: Admitting: Family Medicine

## 2023-12-04 DIAGNOSIS — M5442 Lumbago with sciatica, left side: Secondary | ICD-10-CM

## 2023-12-04 DIAGNOSIS — G8929 Other chronic pain: Secondary | ICD-10-CM | POA: Diagnosis not present

## 2023-12-04 DIAGNOSIS — M5441 Lumbago with sciatica, right side: Secondary | ICD-10-CM

## 2023-12-04 MED ORDER — OXYCODONE-ACETAMINOPHEN 10-325 MG PO TABS: 1.0000 | ORAL_TABLET | Freq: Four times a day (QID) | ORAL | 0 refills | Status: AC | PRN

## 2023-12-04 MED ORDER — AMPHETAMINE-DEXTROAMPHETAMINE 20 MG PO TABS: 20.0000 mg | ORAL_TABLET | Freq: Every day | ORAL | 0 refills | Status: AC

## 2023-12-04 NOTE — Progress Notes (Signed)
 Subjective:    Patient ID: James Bradley, male    DOB: [birth year], 53 y.o.   MRN: [id number]  HPI Virtual Visit via Video Note  I connected with the patient on 12/04/23 at 10:30 AM EST by a video enabled telemedicine application and verified that I am speaking with the correct person using two identifiers.  Location patient: home Location provider:work or home office Persons participating in the virtual visit: patient, provider  I discussed the limitations of evaluation and management by telemedicine and the availability of in person appointments. The patient expressed understanding and agreed to proceed.   HPI: Here for pain management. He is doing about the same.    ROS: See pertinent positives and negatives per HPI.  Past Medical History:  Diagnosis Date   Anxiety    Cancer [facility])    prostate , sees Dr. Watt    GERD (gastroesophageal reflux disease)    Herpes labialis    Insomnia    Low back pain    Onychomycosis    Prostatitis     Past Surgical History:  Procedure Laterality Date   COLONOSCOPY  August 2013    per Dr. Belvie Just , clear, repeat in 10 yrs    ESI Dr Barbarann     microdiskectomy  2009   at L5-S1, per Dr. Oneil Sole    PROSTATECTOMY  08-20-10   per Dr. Watt    Family History  Problem Relation Age of Onset   Arthritis Other    Diabetes Other      Current Outpatient Medications:    ALPRAZolam  (XANAX ) 1 MG tablet, Take 1 tablet (1 mg total) by mouth 3 (three) times daily as needed for anxiety. for anxiety, Disp: 90 tablet, Rfl: 5   amphetamine -dextroamphetamine  (ADDERALL) 20 MG tablet, Take 1 tablet (20 mg total) by mouth daily., Disp: 30 tablet, Rfl: 0   amphetamine -dextroamphetamine  (ADDERALL) 20 MG tablet, Take 1 tablet (20 mg total) by mouth daily., Disp: 30 tablet, Rfl: 0   amphetamine -dextroamphetamine  (ADDERALL) 20 MG tablet, Take 1 tablet (20 mg total) by mouth daily., Disp: 30 tablet, Rfl: 0   levothyroxine  (SYNTHROID ) 75 MCG tablet,  Take 1 tablet (75 mcg total) by mouth daily., Disp: 90 tablet, Rfl: 3   omeprazole  (PRILOSEC) 40 MG capsule, Take 1 capsule (40 mg total) by mouth daily., Disp: 90 capsule, Rfl: 3   oxyCODONE -acetaminophen  (PERCOCET ) 10-325 MG tablet, Take 1 tablet by mouth every 6 (six) hours as needed for pain., Disp: 120 tablet, Rfl: 0   oxyCODONE -acetaminophen  (PERCOCET ) 10-325 MG tablet, Take 1 tablet by mouth every 6 (six) hours as needed for pain., Disp: 120 tablet, Rfl: 0   oxyCODONE -acetaminophen  (PERCOCET ) 10-325 MG tablet, Take 1 tablet by mouth every 6 (six) hours as needed for pain., Disp: 120 tablet, Rfl: 0   predniSONE  (DELTASONE ) 10 MG tablet, Take 1 tablet (10 mg total) by mouth 2 (two) times daily as needed (pain)., Disp: 60 tablet, Rfl: 5   valACYclovir  (VALTREX ) 500 MG tablet, Take 1 tablet (500 mg total) by mouth 2 (two) times daily as needed (cold sores)., Disp: 60 tablet, Rfl: 11   zolpidem  (AMBIEN ) 10 MG tablet, Take 1 tablet (10 mg total) by mouth at bedtime as needed for sleep., Disp: 30 tablet, Rfl: 5  EXAM:  VITALS per patient if applicable:  GENERAL: alert, oriented, appears well and in no acute distress  HEENT: atraumatic, conjunttiva clear, no obvious abnormalities on inspection of external nose and ears  NECK: normal  movements of the head and neck  LUNGS: on inspection no signs of respiratory distress, breathing rate appears normal, no obvious gross SOB, gasping or wheezing  CV: no obvious cyanosis  MS: moves all visible extremities without noticeable abnormality  PSYCH/NEURO: pleasant and cooperative, no obvious depression or anxiety, speech and thought processing grossly intact  ASSESSMENT AND PLAN: Pain management. Indication for chronic opioid: low back pain Medication and dose: Percocet  10-325 # pills per month: 120 Last UDS date: 01-13-23 Opioid Treatment Agreement signed (Y/N): 04-19-17 Opioid Treatment Agreement last reviewed with patient:  12-04-23 [facility]  reviewed this encounter (include red flags): Yes Meds were refilled.  Garnette Olmsted, MD  Discussed the following assessment and plan:  No diagnosis found.     I discussed the assessment and treatment plan with the patient. The patient was provided an opportunity to ask questions and all were answered. The patient agreed with the plan and demonstrated an understanding of the instructions.   The patient was advised to call back or seek an in-person evaluation if the symptoms worsen or if the condition fails to improve as anticipated.      Review of Systems     Objective:   Physical Exam        Assessment & Plan:

## 2024-01-25 ENCOUNTER — Ambulatory Visit: Admitting: Family Medicine

## 2024-01-25 ENCOUNTER — Encounter: Payer: Self-pay | Admitting: Family Medicine

## 2024-01-25 VITALS — BP 110/62 | HR 73 | Temp 97.8°F | Ht 65.0 in | Wt 154.4 lb

## 2024-01-25 DIAGNOSIS — E538 Deficiency of other specified B group vitamins: Secondary | ICD-10-CM

## 2024-01-25 DIAGNOSIS — Z0000 Encounter for general adult medical examination without abnormal findings: Secondary | ICD-10-CM | POA: Diagnosis not present

## 2024-01-25 DIAGNOSIS — G8929 Other chronic pain: Secondary | ICD-10-CM

## 2024-01-25 DIAGNOSIS — E039 Hypothyroidism, unspecified: Secondary | ICD-10-CM | POA: Diagnosis not present

## 2024-01-25 LAB — BASIC METABOLIC PANEL WITH GFR
BUN: 12 mg/dL (ref 6–23)
CO2: 31 meq/L (ref 19–32)
Calcium: 9.5 mg/dL (ref 8.4–10.5)
Chloride: 100 meq/L (ref 96–112)
Creatinine, Ser: 0.87 mg/dL (ref 0.40–1.50)
GFR: 97.43 mL/min
Glucose, Bld: 82 mg/dL (ref 70–99)
Potassium: 4.2 meq/L (ref 3.5–5.1)
Sodium: 137 meq/L (ref 135–145)

## 2024-01-25 LAB — CBC WITH DIFFERENTIAL/PLATELET
Basophils Absolute: 0.1 K/uL (ref 0.0–0.1)
Basophils Relative: 1.3 % (ref 0.0–3.0)
Eosinophils Absolute: 0.2 K/uL (ref 0.0–0.7)
Eosinophils Relative: 4.6 % (ref 0.0–5.0)
HCT: 39.2 % (ref 39.0–52.0)
Hemoglobin: 12.6 g/dL — ABNORMAL LOW (ref 13.0–17.0)
Lymphocytes Relative: 50.9 % — ABNORMAL HIGH (ref 12.0–46.0)
Lymphs Abs: 2.4 K/uL (ref 0.7–4.0)
MCHC: 32.1 g/dL (ref 30.0–36.0)
MCV: 71.4 fl — ABNORMAL LOW (ref 78.0–100.0)
Monocytes Absolute: 0.7 K/uL (ref 0.1–1.0)
Monocytes Relative: 15 % — ABNORMAL HIGH (ref 3.0–12.0)
Neutro Abs: 1.3 K/uL — ABNORMAL LOW (ref 1.4–7.7)
Neutrophils Relative %: 28.2 % — ABNORMAL LOW (ref 43.0–77.0)
Platelets: 285 K/uL (ref 150.0–400.0)
RBC: 5.48 Mil/uL (ref 4.22–5.81)
RDW: 15.9 % — ABNORMAL HIGH (ref 11.5–15.5)
WBC: 4.7 K/uL (ref 4.0–10.5)

## 2024-01-25 LAB — LIPID PANEL
Cholesterol: 170 mg/dL (ref 28–200)
HDL: 72.5 mg/dL
LDL Cholesterol: 80 mg/dL (ref 10–99)
NonHDL: 97.85
Total CHOL/HDL Ratio: 2
Triglycerides: 87 mg/dL (ref 10.0–149.0)
VLDL: 17.4 mg/dL (ref 0.0–40.0)

## 2024-01-25 LAB — TSH: TSH: 1.18 u[IU]/mL (ref 0.35–5.50)

## 2024-01-25 LAB — HEPATIC FUNCTION PANEL
ALT: 15 U/L (ref 3–53)
AST: 19 U/L (ref 5–37)
Albumin: 4.8 g/dL (ref 3.5–5.2)
Alkaline Phosphatase: 56 U/L (ref 39–117)
Bilirubin, Direct: 0.2 mg/dL (ref 0.1–0.3)
Total Bilirubin: 0.9 mg/dL (ref 0.2–1.2)
Total Protein: 7.1 g/dL (ref 6.0–8.3)

## 2024-01-25 LAB — PSA: PSA: 0.06 ng/mL — ABNORMAL LOW (ref 0.10–4.00)

## 2024-01-25 LAB — HEMOGLOBIN A1C: Hgb A1c MFr Bld: 5.9 % (ref 4.6–6.5)

## 2024-01-25 LAB — T3, FREE: T3, Free: 4.3 pg/mL — ABNORMAL HIGH (ref 2.3–4.2)

## 2024-01-25 LAB — VITAMIN B12: Vitamin B-12: 1386 pg/mL — ABNORMAL HIGH (ref 211–911)

## 2024-01-25 LAB — T4, FREE: Free T4: 0.57 ng/dL — ABNORMAL LOW (ref 0.60–1.60)

## 2024-01-25 NOTE — Progress Notes (Signed)
 "  Subjective:    Patient ID: James Bradley, male    DOB: 04-Feb-1970, 54 y.o.   MRN: [id number]  HPI Here for a well exam. He feels fine except for his left hip pain. We ordered an MRI of this last May, but he never pursued this. He now wants to proceed.    Review of Systems  Constitutional: Negative.   HENT: Negative.    Eyes: Negative.   Respiratory: Negative.    Cardiovascular: Negative.   Gastrointestinal: Negative.   Genitourinary: Negative.   Musculoskeletal:  Positive for arthralgias.  Skin: Negative.   Neurological: Negative.   Psychiatric/Behavioral: Negative.         Objective:   Physical Exam Constitutional:      General: He is not in acute distress.    Appearance: Normal appearance. He is well-developed. He is not diaphoretic.  HENT:     Head: Normocephalic and atraumatic.     Right Ear: External ear normal.     Left Ear: External ear normal.     Nose: Nose normal.     Mouth/Throat:     Pharynx: No oropharyngeal exudate.  Eyes:     General: No scleral icterus.       Right eye: No discharge.        Left eye: No discharge.     Conjunctiva/sclera: Conjunctivae normal.     Pupils: Pupils are equal, round, and reactive to light.  Neck:     Thyroid : No thyromegaly.     Vascular: No JVD.     Trachea: No tracheal deviation.  Cardiovascular:     Rate and Rhythm: Normal rate and regular rhythm.     Pulses: Normal pulses.     Heart sounds: Normal heart sounds. No murmur heard.    No friction rub. No gallop.  Pulmonary:     Effort: Pulmonary effort is normal. No respiratory distress.     Breath sounds: Normal breath sounds. No wheezing or rales.  Chest:     Chest wall: No tenderness.  Abdominal:     General: Bowel sounds are normal. There is no distension.     Palpations: Abdomen is soft. There is no mass.     Tenderness: There is no abdominal tenderness. There is no guarding or rebound.  Genitourinary:    Penis: Normal. No tenderness.      Testes:  Normal.  Musculoskeletal:        General: No tenderness. Normal range of motion.     Cervical back: Neck supple.  Lymphadenopathy:     Cervical: No cervical adenopathy.  Skin:    General: Skin is warm and dry.     Coloration: Skin is not pale.     Findings: No erythema or rash.  Neurological:     General: No focal deficit present.     Mental Status: He is alert and oriented to person, place, and time.     Cranial Nerves: No cranial nerve deficit.     Motor: No abnormal muscle tone.     Coordination: Coordination normal.     Deep Tendon Reflexes: Reflexes are normal and symmetric. Reflexes normal.  Psychiatric:        Mood and Affect: Mood normal.        Behavior: Behavior normal.        Thought Content: Thought content normal.        Judgment: Judgment normal.           Assessment & Plan:  Well exam. We discussed diet and exercise. Get fasting labs. His order for an MRI of the left hip is still active so he will contact the imaging facility to set this up. We will refer him for another colonoscopy.  Garnette Olmsted, MD    "

## 2024-01-26 ENCOUNTER — Ambulatory Visit: Payer: Self-pay | Admitting: Family Medicine

## 2024-01-29 ENCOUNTER — Ambulatory Visit: Admitting: Family Medicine

## 2024-01-29 LAB — DRUG MONITORING, PANEL 8 WITH CONFIRMATION, URINE
6 Acetylmorphine: NEGATIVE ng/mL
Alcohol Metabolites: NEGATIVE ng/mL
Alphahydroxyalprazolam: 577 ng/mL — ABNORMAL HIGH
Alphahydroxymidazolam: NEGATIVE ng/mL
Alphahydroxytriazolam: NEGATIVE ng/mL
Aminoclonazepam: NEGATIVE ng/mL
Amphetamine: 11891 ng/mL — ABNORMAL HIGH
Amphetamines: POSITIVE ng/mL — AB
Benzodiazepines: POSITIVE ng/mL — AB
Buprenorphine, Urine: NEGATIVE ng/mL
Cocaine Metabolite: NEGATIVE ng/mL
Codeine: NEGATIVE ng/mL
Creatinine: 133.6 mg/dL
Hydrocodone: NEGATIVE ng/mL
Hydromorphone: NEGATIVE ng/mL
Hydroxyethylflurazepam: NEGATIVE ng/mL
Lorazepam: NEGATIVE ng/mL
MDMA: NEGATIVE ng/mL
Marijuana Metabolite: NEGATIVE ng/mL
Methamphetamine: NEGATIVE ng/mL
Morphine: NEGATIVE ng/mL
Nordiazepam: NEGATIVE ng/mL
Norhydrocodone: NEGATIVE ng/mL
Noroxycodone: >10000 ng/mL — ABNORMAL HIGH
Opiates: NEGATIVE ng/mL
Oxazepam: NEGATIVE ng/mL
Oxidant: NEGATIVE ug/mL
Oxycodone: 9556 ng/mL — ABNORMAL HIGH
Oxycodone: POSITIVE ng/mL — AB
Oxymorphone: 1873 ng/mL — ABNORMAL HIGH
Temazepam: NEGATIVE ng/mL
pH: 5 (ref 4.5–9.0)

## 2024-01-29 LAB — DM TEMPLATE

## 2024-02-03 ENCOUNTER — Telehealth: Admitting: Family Medicine

## 2024-02-03 ENCOUNTER — Ambulatory Visit: Admitting: Family Medicine

## 2024-02-03 ENCOUNTER — Encounter: Payer: Self-pay | Admitting: Family Medicine

## 2024-02-03 DIAGNOSIS — J019 Acute sinusitis, unspecified: Secondary | ICD-10-CM | POA: Diagnosis not present

## 2024-02-03 MED ORDER — AZITHROMYCIN 250 MG PO TABS: ORAL_TABLET | ORAL | 0 refills | Status: AC

## 2024-02-03 NOTE — Progress Notes (Signed)
 "  Subjective:    Patient ID: James Bradley, male    DOB: 1971-01-01, 54 y.o.   MRN: [id number]  HPI Virtual Visit via Video Note  I connected with the patient on 02/03/24 at  9:15 AM EST by a video enabled telemedicine application and verified that I am speaking with the correct person using two identifiers.  Location patient: home Location provider:work or home office Persons participating in the virtual visit: patient, provider  I discussed the limitations of evaluation and management by telemedicine and the availability of in person appointments. The patient expressed understanding and agreed to proceed.   HPI: Here for 4 days of sinus pressure, PND, and dry cough. No fever or ST or body aches. Taking Mucinex.    ROS: See pertinent positives and negatives per HPI.  Past Medical History:  Diagnosis Date   Anxiety    Cancer [facility])    prostate , sees Dr. Watt    GERD (gastroesophageal reflux disease)    Herpes labialis    Insomnia    Low back pain    Onychomycosis    Prostatitis     Past Surgical History:  Procedure Laterality Date   COLONOSCOPY  August 2013    per Dr. Belvie Just , clear, repeat in 10 yrs    ESI Dr Barbarann     microdiskectomy  2009   at L5-S1, per Dr. Oneil Sole    PROSTATECTOMY  08-20-10   per Dr. Watt    Family History  Problem Relation Age of Onset   Arthritis Other    Diabetes Other     Current Medications[1]  EXAM:  VITALS per patient if applicable:  GENERAL: alert, oriented, appears well and in no acute distress  HEENT: atraumatic, conjunttiva clear, no obvious abnormalities on inspection of external nose and ears  NECK: normal movements of the head and neck  LUNGS: on inspection no signs of respiratory distress, breathing rate appears normal, no obvious gross SOB, gasping or wheezing  CV: no obvious cyanosis  MS: moves all visible extremities without noticeable abnormality  PSYCH/NEURO: pleasant and cooperative, no  obvious depression or anxiety, speech and thought processing grossly intact  ASSESSMENT AND PLAN: Sinusitis, treat with a Zpack. Garnette Olmsted, MD  Discussed the following assessment and plan:  No diagnosis found.     I discussed the assessment and treatment plan with the patient. The patient was provided an opportunity to ask questions and all were answered. The patient agreed with the plan and demonstrated an understanding of the instructions.   The patient was advised to call back or seek an in-person evaluation if the symptoms worsen or if the condition fails to improve as anticipated.      Review of Systems     Objective:   Physical Exam        Assessment & Plan:       [1]  Current Outpatient Medications:    ALPRAZolam  (XANAX ) 1 MG tablet, Take 1 tablet (1 mg total) by mouth 3 (three) times daily as needed for anxiety. for anxiety, Disp: 90 tablet, Rfl: 5   amphetamine -dextroamphetamine  (ADDERALL) 20 MG tablet, Take 1 tablet (20 mg total) by mouth daily., Disp: 30 tablet, Rfl: 0   amphetamine -dextroamphetamine  (ADDERALL) 20 MG tablet, Take 1 tablet (20 mg total) by mouth daily., Disp: 30 tablet, Rfl: 0   amphetamine -dextroamphetamine  (ADDERALL) 20 MG tablet, Take 1 tablet (20 mg total) by mouth daily., Disp: 30 tablet, Rfl: 0   levothyroxine  (SYNTHROID )  75 MCG tablet, Take 1 tablet (75 mcg total) by mouth daily., Disp: 90 tablet, Rfl: 3   omeprazole  (PRILOSEC) 40 MG capsule, Take 1 capsule (40 mg total) by mouth daily., Disp: 90 capsule, Rfl: 3   oxyCODONE -acetaminophen  (PERCOCET ) 10-325 MG tablet, Take 1 tablet by mouth every 6 (six) hours as needed for pain., Disp: 120 tablet, Rfl: 0   oxyCODONE -acetaminophen  (PERCOCET ) 10-325 MG tablet, Take 1 tablet by mouth every 6 (six) hours as needed for pain., Disp: 120 tablet, Rfl: 0   oxyCODONE -acetaminophen  (PERCOCET ) 10-325 MG tablet, Take 1 tablet by mouth every 6 (six) hours as needed for pain., Disp: 120 tablet, Rfl: 0    predniSONE  (DELTASONE ) 10 MG tablet, Take 1 tablet (10 mg total) by mouth 2 (two) times daily as needed (pain)., Disp: 60 tablet, Rfl: 5   valACYclovir  (VALTREX ) 500 MG tablet, Take 1 tablet (500 mg total) by mouth 2 (two) times daily as needed (cold sores)., Disp: 60 tablet, Rfl: 11   zolpidem  (AMBIEN ) 10 MG tablet, Take 1 tablet (10 mg total) by mouth at bedtime as needed for sleep., Disp: 30 tablet, Rfl: 5  "
